# Patient Record
Sex: Female | Born: 1975 | Race: White | Hispanic: No | Marital: Married | Smoking: Never smoker
Health system: Southern US, Community
[De-identification: ages and names within clinical notes are randomized; demographics above are authoritative.]

## PROBLEM LIST (undated history)

## (undated) DIAGNOSIS — N159 Renal tubulo-interstitial disease, unspecified: Secondary | ICD-10-CM

---

## 1999-01-31 ENCOUNTER — Other Ambulatory Visit: Admission: RE | Admit: 1999-01-31 | Discharge: 1999-01-31 | Payer: Self-pay | Admitting: Obstetrics and Gynecology

## 1999-02-11 ENCOUNTER — Emergency Department (HOSPITAL_COMMUNITY): Admission: EM | Admit: 1999-02-11 | Discharge: 1999-02-11 | Payer: Self-pay | Admitting: Emergency Medicine

## 1999-08-27 ENCOUNTER — Inpatient Hospital Stay (HOSPITAL_COMMUNITY): Admission: AD | Admit: 1999-08-27 | Discharge: 1999-08-29 | Payer: Self-pay | Admitting: Obstetrics and Gynecology

## 1999-09-30 ENCOUNTER — Other Ambulatory Visit: Admission: RE | Admit: 1999-09-30 | Discharge: 1999-09-30 | Payer: Self-pay | Admitting: Obstetrics and Gynecology

## 2000-12-14 ENCOUNTER — Other Ambulatory Visit: Admission: RE | Admit: 2000-12-14 | Discharge: 2000-12-14 | Payer: Self-pay | Admitting: Obstetrics and Gynecology

## 2001-10-07 ENCOUNTER — Inpatient Hospital Stay (HOSPITAL_COMMUNITY): Admission: AD | Admit: 2001-10-07 | Discharge: 2001-10-09 | Payer: Self-pay | Admitting: Obstetrics and Gynecology

## 2001-12-13 ENCOUNTER — Other Ambulatory Visit: Admission: RE | Admit: 2001-12-13 | Discharge: 2001-12-13 | Payer: Self-pay | Admitting: Obstetrics and Gynecology

## 2003-04-18 ENCOUNTER — Other Ambulatory Visit: Admission: RE | Admit: 2003-04-18 | Discharge: 2003-04-18 | Payer: Self-pay | Admitting: Obstetrics and Gynecology

## 2005-01-08 ENCOUNTER — Ambulatory Visit: Payer: Self-pay | Admitting: Family Medicine

## 2005-11-24 ENCOUNTER — Ambulatory Visit: Payer: Self-pay | Admitting: Internal Medicine

## 2006-08-17 ENCOUNTER — Ambulatory Visit: Payer: Self-pay | Admitting: Family Medicine

## 2010-04-03 ENCOUNTER — Emergency Department (HOSPITAL_COMMUNITY): Admission: EM | Admit: 2010-04-03 | Discharge: 2010-04-03 | Payer: Self-pay | Admitting: Emergency Medicine

## 2018-03-22 ENCOUNTER — Ambulatory Visit: Payer: BLUE CROSS/BLUE SHIELD | Admitting: Psychology

## 2018-03-23 ENCOUNTER — Ambulatory Visit: Payer: BLUE CROSS/BLUE SHIELD | Admitting: Psychology

## 2019-09-30 ENCOUNTER — Other Ambulatory Visit: Payer: Self-pay | Admitting: Obstetrics and Gynecology

## 2019-09-30 DIAGNOSIS — E049 Nontoxic goiter, unspecified: Secondary | ICD-10-CM

## 2019-10-05 ENCOUNTER — Ambulatory Visit
Admission: RE | Admit: 2019-10-05 | Discharge: 2019-10-05 | Disposition: A | Discharge status: Home / Self Care | Payer: No Typology Code available for payment source | Source: Ambulatory Visit | Attending: Obstetrics and Gynecology | Admitting: Obstetrics and Gynecology

## 2019-10-05 DIAGNOSIS — E049 Nontoxic goiter, unspecified: Secondary | ICD-10-CM

## 2019-10-05 IMAGING — US US THYROID
1 series · 13 of 25 positions shown · non-contrast
Comparison: None.

CLINICAL DATA: Palpable abnormality. Thyromegaly on physical
examination.

EXAM:
THYROID ULTRASOUND
TECHNIQUE: Ultrasound examination of the thyroid gland and adjacent soft
tissues was performed.

[Series 1: us thyroid · 0.06mm/px · 13 of 46 slices shown]
[im 1/46]
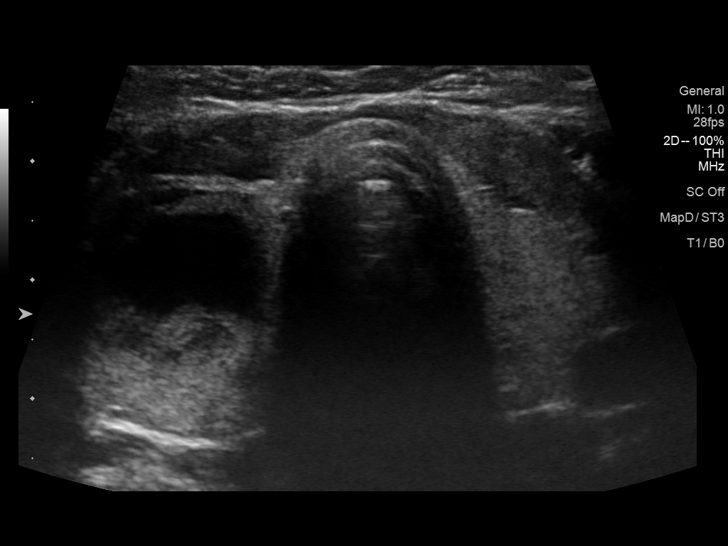
[im 4/46]
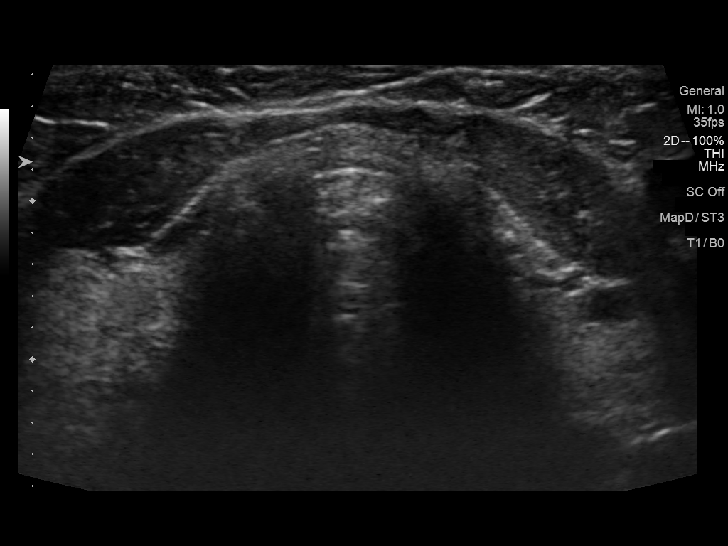
[im 8/46]
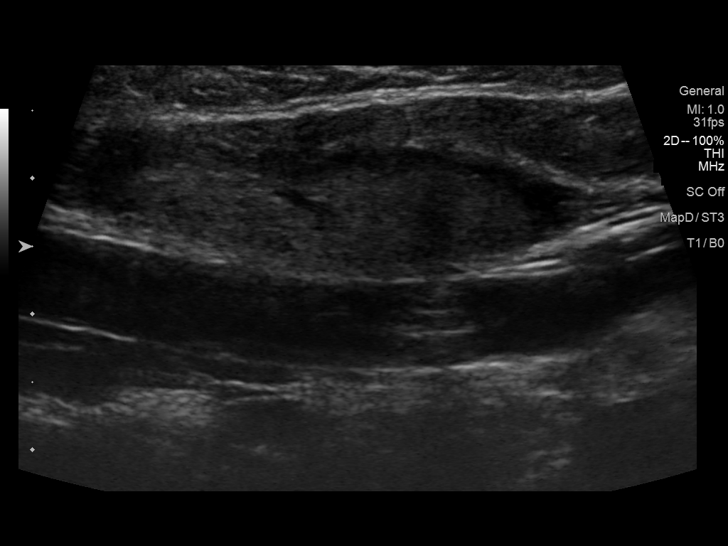
[im 12/46]
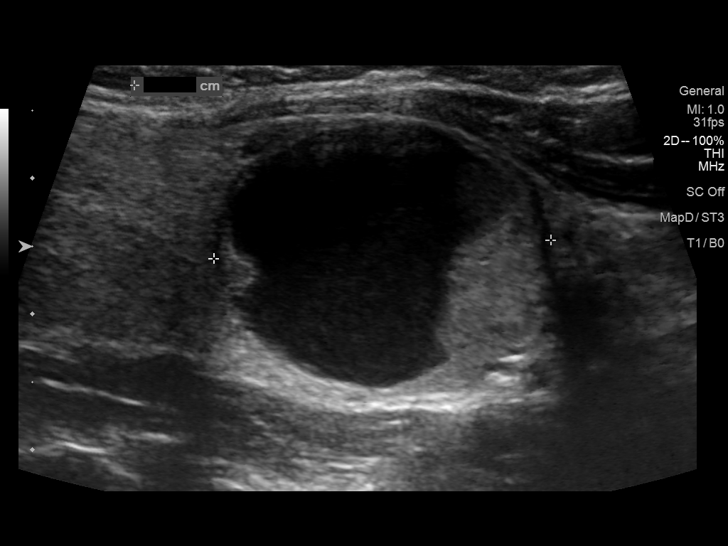
[im 16/46]
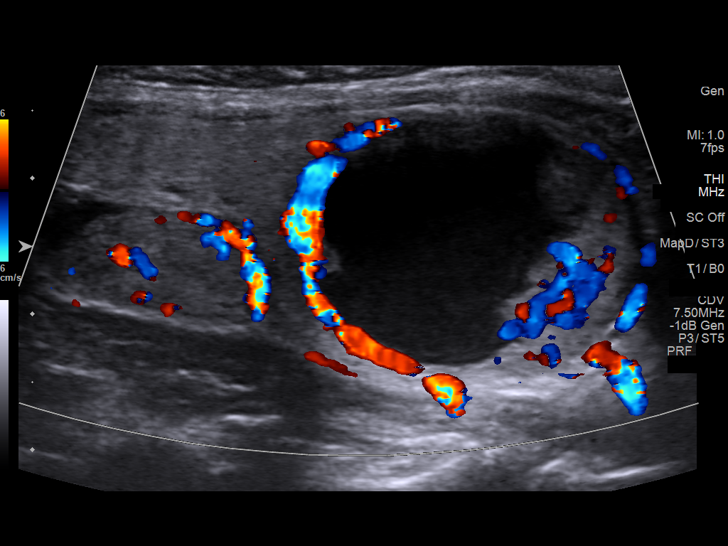
[im 19/46]
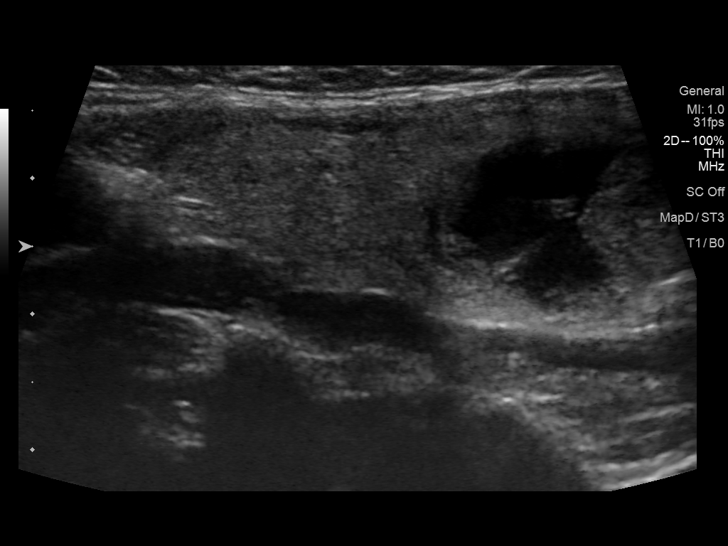
[im 23/46]
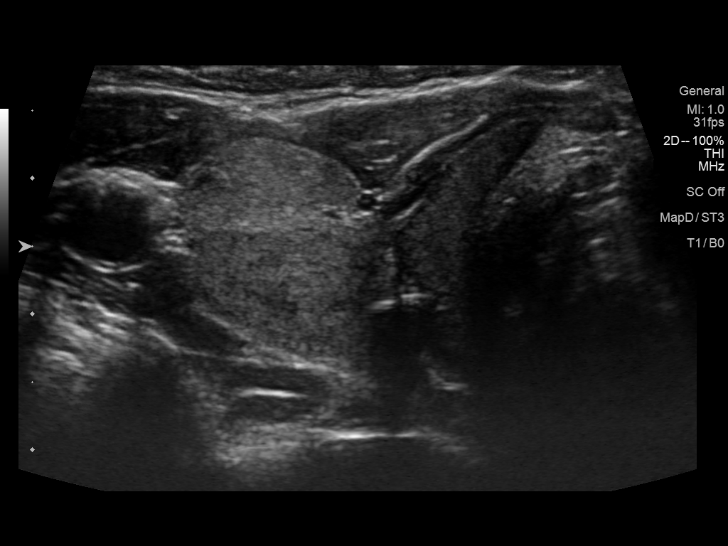
[im 27/46]
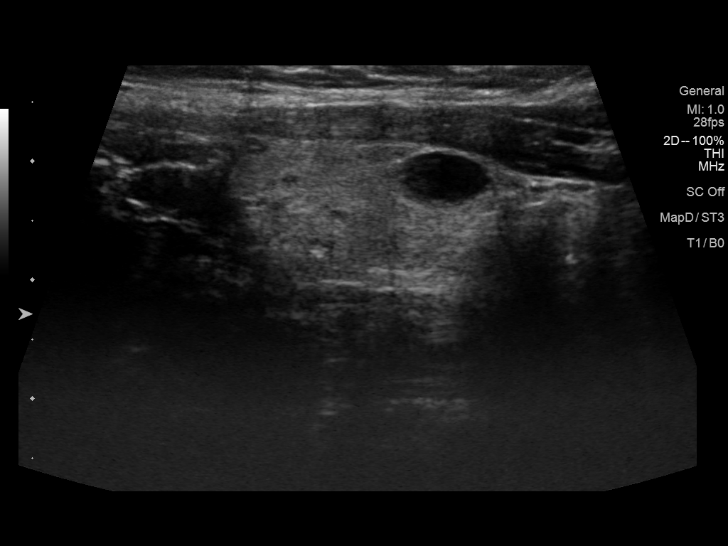
[im 31/46]
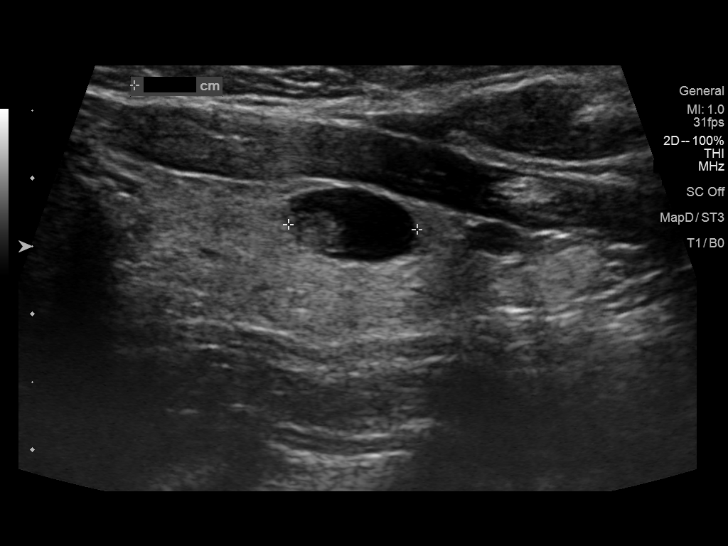
[im 34/46]
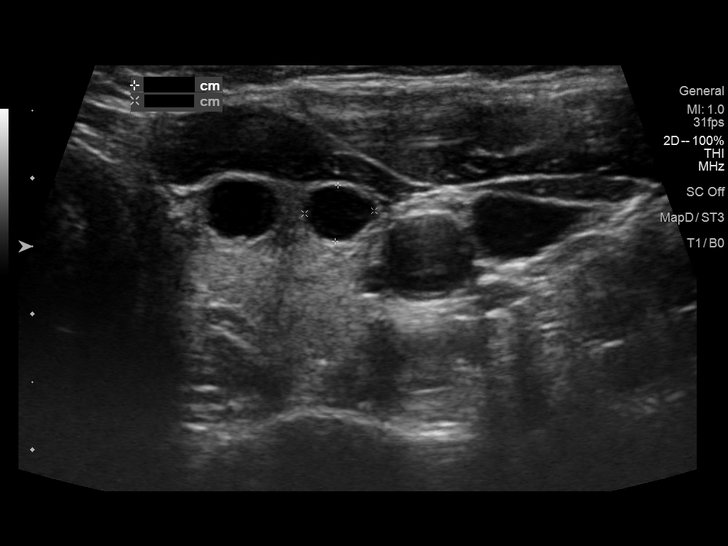
[im 38/46]
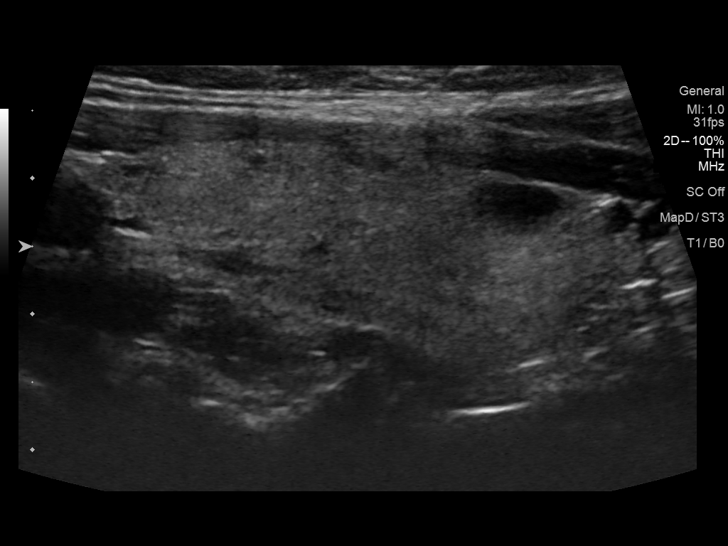
[im 42/46]
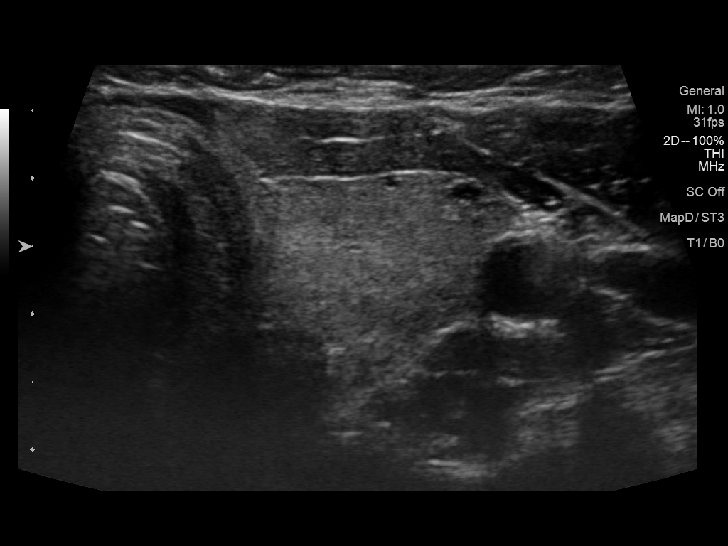
[im 46/46]
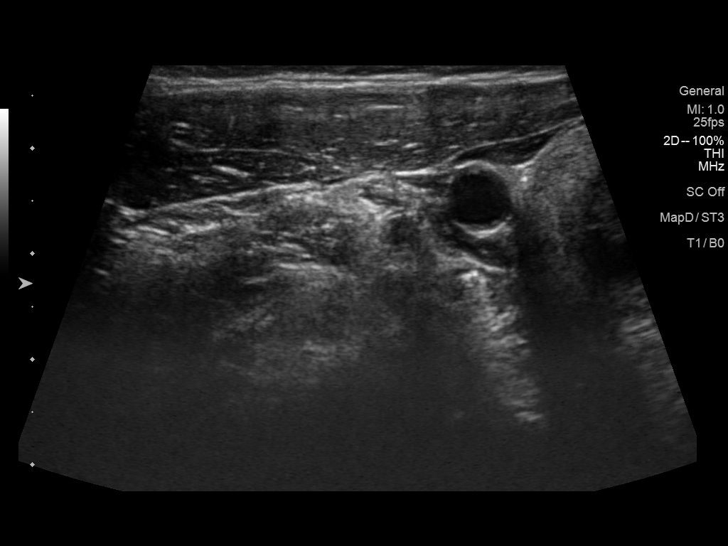

[13 of 25 positions shown; findings below may reference images not displayed]

FINDINGS: Parenchymal Echotexture: Mildly heterogenous

Isthmus: Normal in size measures 0.3 cm in diameter

Right lobe: Normal in size measuring 4.6 x 2.2 x 2.1 cm

Left lobe: Normal in size measuring 4.6 x 1.6 x 1.9 cm

_________________________________________________________

Estimated total number of nodules >/= 1 cm: 3

Number of spongiform nodules >/=  2 cm not described below (TR1): 0

Number of mixed cystic and solid nodules >/= 1.5 cm not described
below (TR2): 0

_________________________________________________________

Nodule # 1:

Location: Right; Superior

Maximum size: 1.8 cm; Other 2 dimensions: 1.4 x 1.0 cm

Composition: solid/almost completely solid (2)

Echogenicity: isoechoic (1)

Shape: not taller-than-wide (0)

Margins: ill-defined (0)

Echogenic foci: none (0)

ACR TI-RADS total points: 3.

ACR TI-RADS risk category: TR3 (3 points).

ACR TI-RADS recommendations:

*Given size (>/= 1.5 - 2.4 cm) and appearance, a follow-up
ultrasound in 1 year should be considered based on TI-RADS criteria.

_________________________________________________________

There is an approximately 2.5 x 2.4 x 2.0 cm partially solid though
predominantly cystic nodule within the inferior pole of the right
lobe of the thyroid (labeled 2) which does not meet criteria to
recommend percutaneous sampling or continued dedicated follow-up

_________________________________________________________

There are scattered punctate (sub 1 cm) cysts scattered within the
left lobe of the thyroid (labeled 3, 4 and 5), none of which meet
imaging criteria to recommend percutaneous sampling or continued
dedicated follow-up.
IMPRESSION: 1. Findings suggestive of multinodular goiter.
2. Nodule #1 meets imaging criteria to recommend a 1 year follow-up.

The above is in keeping with the ACR TI-RADS recommendations - [HOSPITAL] [0B];[DATE].

## 2019-12-23 ENCOUNTER — Other Ambulatory Visit: Payer: Self-pay

## 2019-12-23 ENCOUNTER — Emergency Department (HOSPITAL_COMMUNITY)
Admission: EM | Admit: 2019-12-23 | Discharge: 2019-12-24 | Disposition: A | Discharge status: Home / Self Care | Payer: No Typology Code available for payment source | Attending: Emergency Medicine | Admitting: Emergency Medicine

## 2019-12-23 ENCOUNTER — Encounter (HOSPITAL_COMMUNITY): Payer: Self-pay | Admitting: Emergency Medicine

## 2019-12-23 ENCOUNTER — Emergency Department (HOSPITAL_COMMUNITY): Payer: No Typology Code available for payment source

## 2019-12-23 DIAGNOSIS — R071 Chest pain on breathing: Secondary | ICD-10-CM | POA: Diagnosis not present

## 2019-12-23 DIAGNOSIS — R0789 Other chest pain: Secondary | ICD-10-CM | POA: Diagnosis present

## 2019-12-23 HISTORY — DX: Renal tubulo-interstitial disease, unspecified: N15.9

## 2019-12-23 LAB — URINALYSIS, ROUTINE W REFLEX MICROSCOPIC
Bilirubin Urine: NEGATIVE
Glucose, UA: NEGATIVE mg/dL
Hgb urine dipstick: NEGATIVE
Ketones, ur: NEGATIVE mg/dL
Leukocytes,Ua: NEGATIVE
Nitrite: NEGATIVE
Protein, ur: NEGATIVE mg/dL
Specific Gravity, Urine: 1.014 (ref 1.005–1.030)
pH: 7 (ref 5.0–8.0)

## 2019-12-23 LAB — BASIC METABOLIC PANEL
Anion gap: 13 (ref 5–15)
BUN: 17 mg/dL (ref 6–20)
CO2: 24 mmol/L (ref 22–32)
Calcium: 9.2 mg/dL (ref 8.9–10.3)
Chloride: 103 mmol/L (ref 98–111)
Creatinine, Ser: 0.87 mg/dL (ref 0.44–1.00)
GFR calc Af Amer: >60 mL/min (ref >60)
GFR calc non Af Amer: >60 mL/min (ref >60)
Glucose, Bld: 109 mg/dL — ABNORMAL HIGH (ref 70–99)
Potassium: 3.8 mmol/L (ref 3.5–5.1)
Sodium: 140 mmol/L (ref 135–145)

## 2019-12-23 LAB — I-STAT BETA HCG BLOOD, ED (MC, WL, AP ONLY): I-stat hCG, quantitative: <5 m[IU]/mL (ref <5)

## 2019-12-23 LAB — TROPONIN I (HIGH SENSITIVITY)
Troponin I (High Sensitivity): 3 ng/L (ref <18)
Troponin I (High Sensitivity): 4 ng/L (ref <18)

## 2019-12-23 LAB — CBC
HCT: 40.8 % (ref 36.0–46.0)
Hemoglobin: 13.3 g/dL (ref 12.0–15.0)
MCH: 30.3 pg (ref 26.0–34.0)
MCHC: 32.6 g/dL (ref 30.0–36.0)
MCV: 92.9 fL (ref 80.0–100.0)
Platelets: 272 10*3/uL (ref 150–400)
RBC: 4.39 MIL/uL (ref 3.87–5.11)
RDW: 11.9 % (ref 11.5–15.5)
WBC: 13.6 10*3/uL — ABNORMAL HIGH (ref 4.0–10.5)
nRBC: 0 % (ref 0.0–0.2)

## 2019-12-23 IMAGING — CR DG CHEST 2V
2 series · 2 of 2 positions shown · non-contrast
Comparison: None.

CLINICAL DATA: 43-year-old female with chest pain.

EXAM:
CHEST - 2 VIEW

[chest pa]
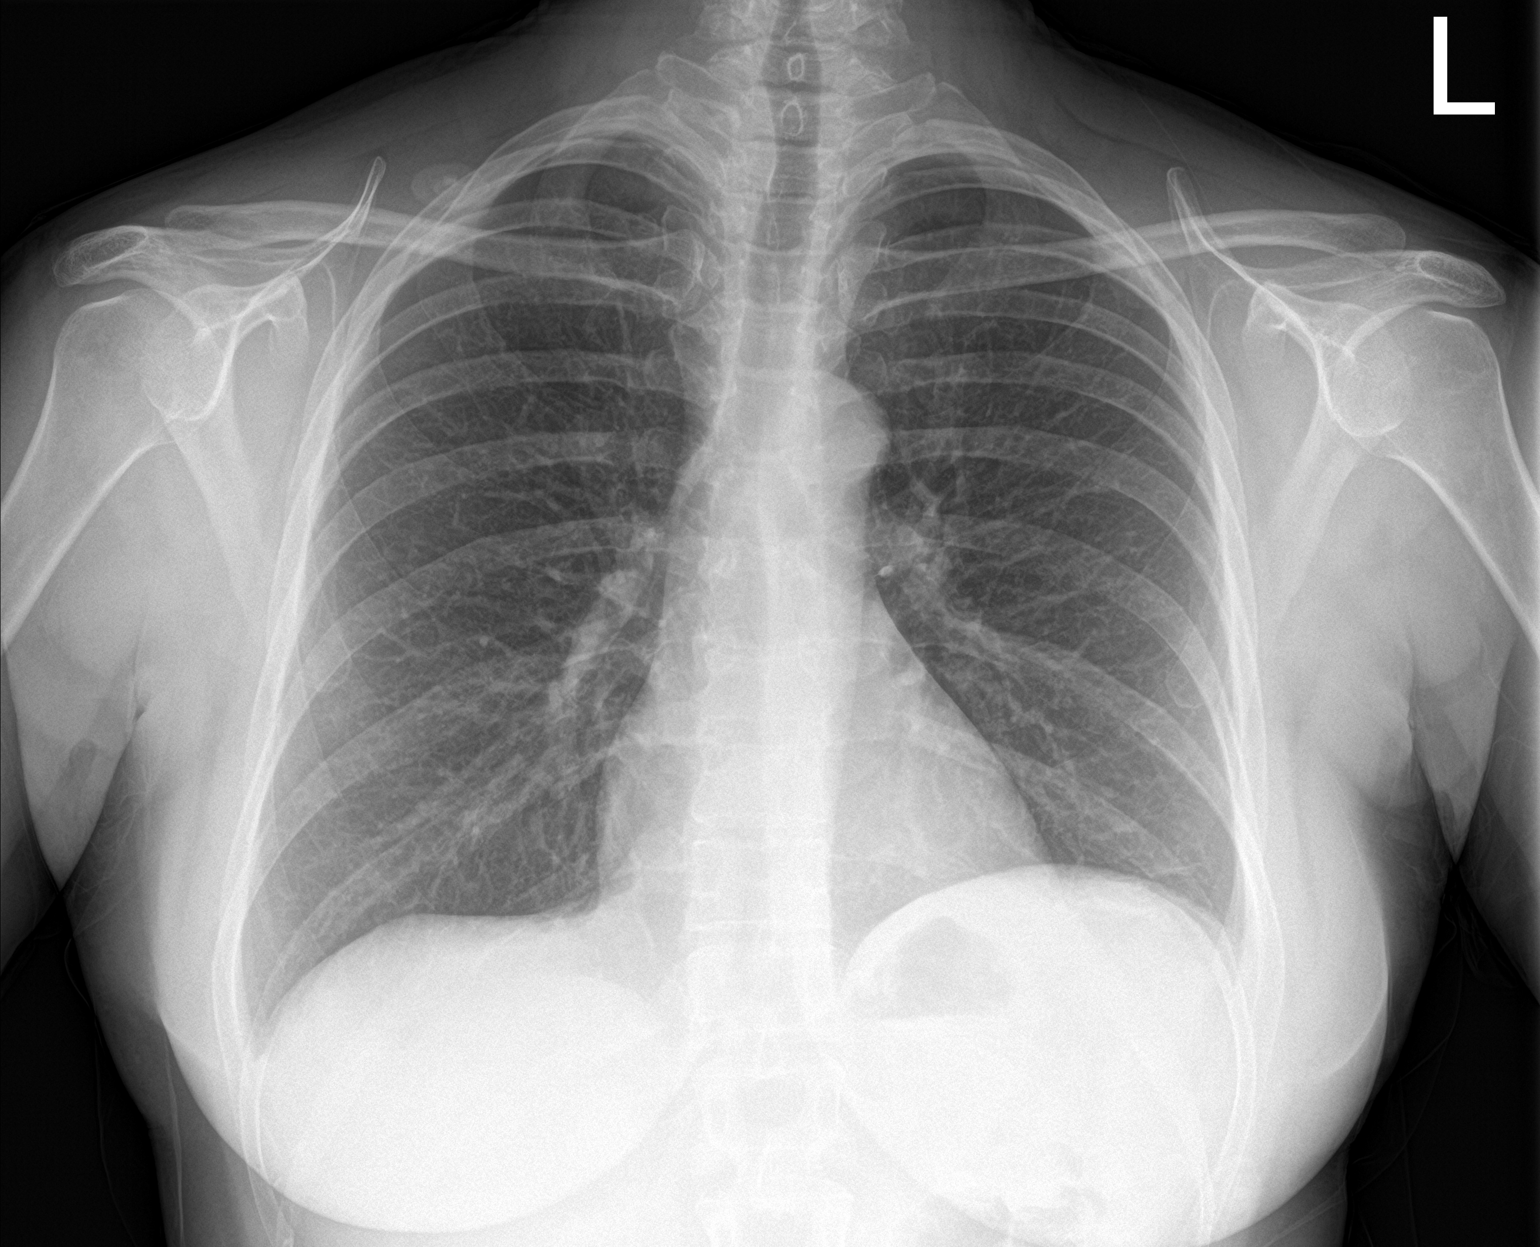

[chest lat]
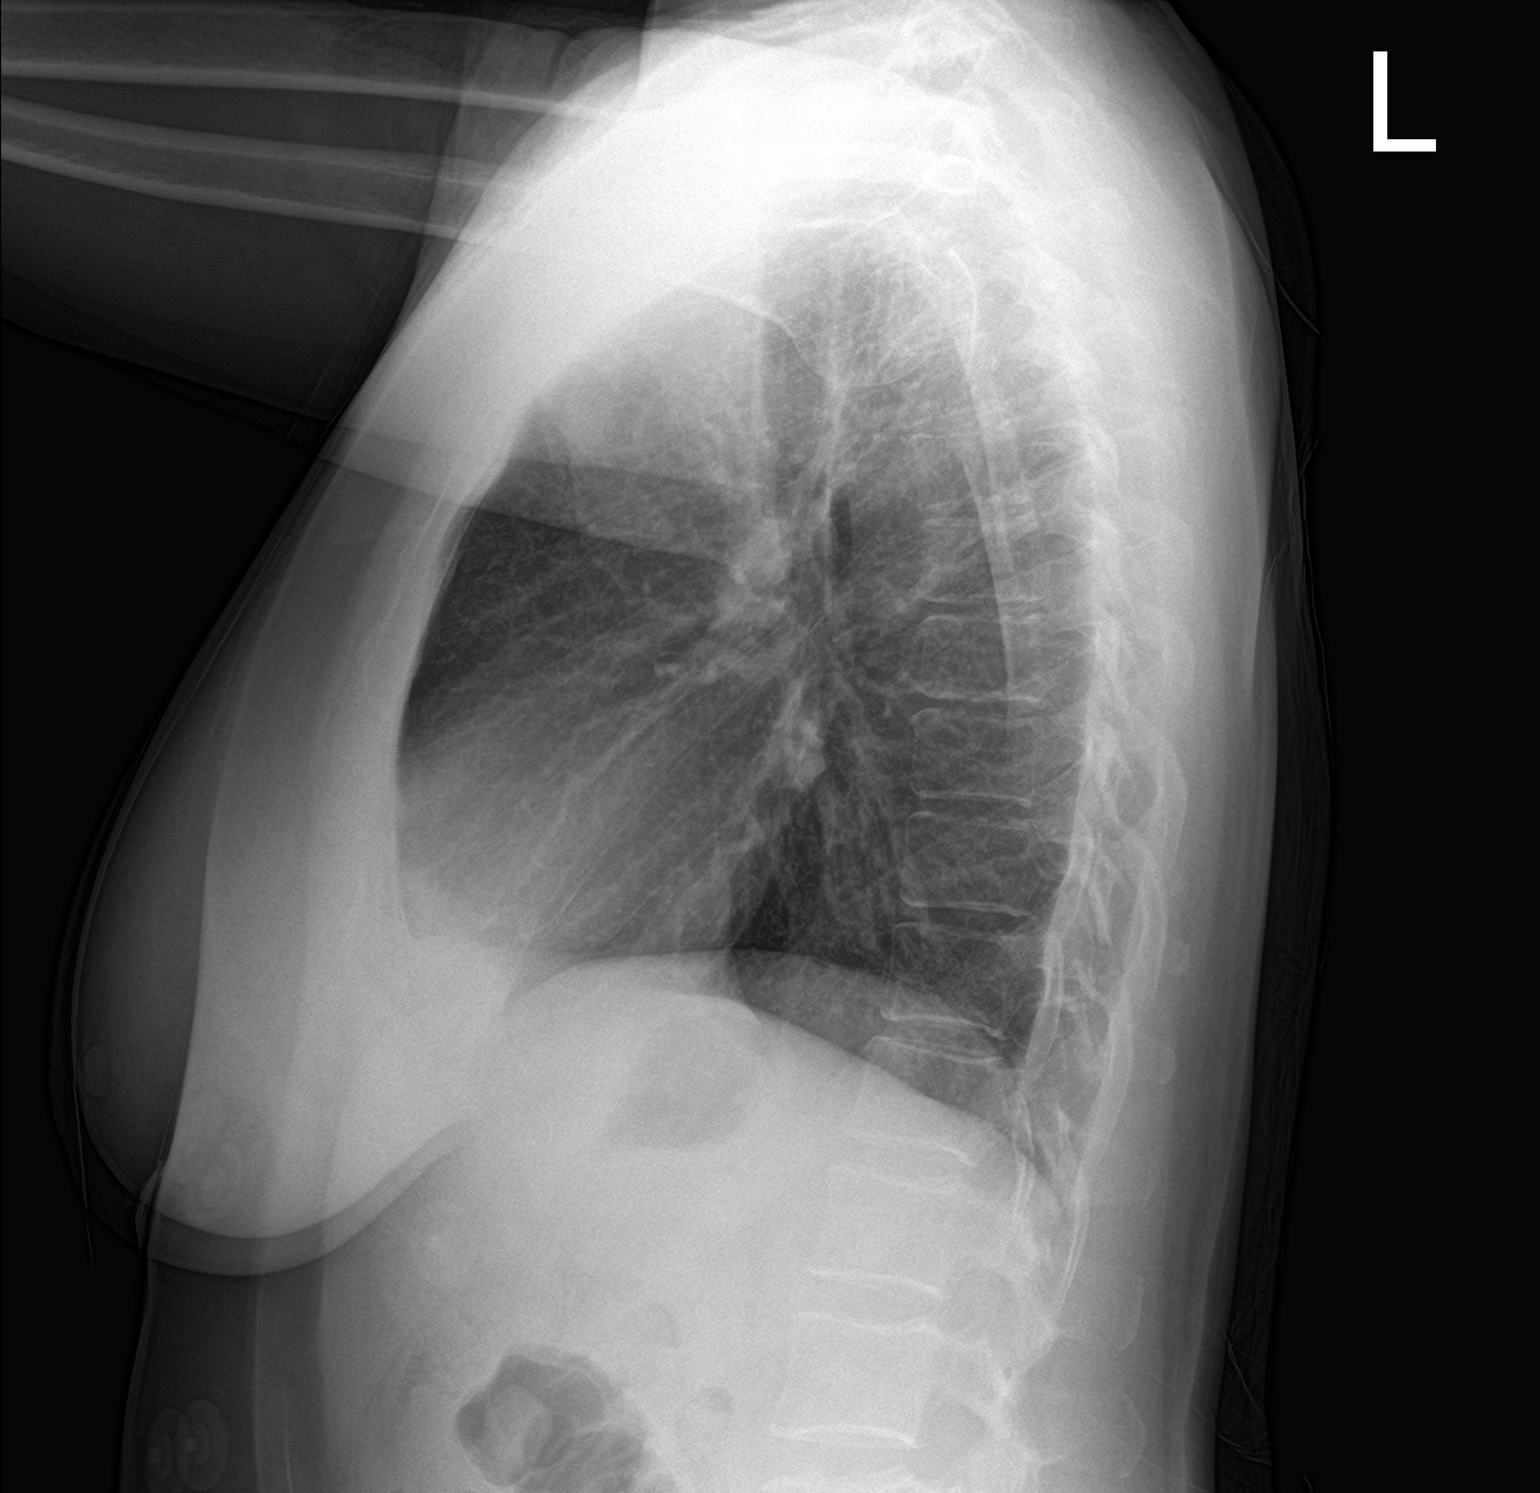

[2 of 2 positions shown; findings below may reference images not displayed]

FINDINGS: The heart size and mediastinal contours are within normal limits.
Both lungs are clear. The visualized skeletal structures are
unremarkable.
IMPRESSION: No active cardiopulmonary disease.

## 2019-12-23 MED ORDER — SODIUM CHLORIDE 0.9% FLUSH: 3.0000 mL | Freq: Once | INTRAVENOUS | Status: DC

## 2019-12-23 NOTE — ED Triage Notes (Signed)
C/o stabbing pain under L breast with deep inspiration since 4:30 am.  Reports fever since Monday.  Reports cloudy, foul smelling urine x 2 weeks.  Started Keflex Rx she had at home.

## 2019-12-23 NOTE — ED Provider Notes (Signed)
[facility] EMERGENCY DEPARTMENT Provider Note   CSN: [id number] Arrival date & time: 12/23/19  1751     History Chief Complaint  Patient presents with  . Chest Pain    Sharon Ross is a 44 y.o. female.  HPI Patient presents to the emergency department with discomfort under the lower ribs on the left.  The patient states that started around 430 this morning.  She states that it hurts worse with deep inspiration.  The patient states she took some ibuprofen and Tylenol without significant relief of her symptoms.  The patient states that she does not have shortness of breath or other symptoms at this time.  Patient states that she had some cloudy urine and it had a bad smell so she started Keflex this past Monday.  Patient states she also felt like she had a fever but no other symptoms.  Patient states that her urinary symptoms have resolved.  The patient denies shortness of breath, headache,blurred vision, neck pain, fever, cough, weakness, numbness, dizziness, anorexia, edema, abdominal pain, nausea, vomiting, diarrhea, rash, back pain, dysuria, hematemesis, bloody stool, near syncope, or syncope.    Past Medical History:  Diagnosis Date  . Kidney infection     There are no problems to display for this patient.   History reviewed. No pertinent surgical history.   OB History   No obstetric history on file.     No family history on file.  Social History   Tobacco Use  . Smoking status: Never Smoker  . Smokeless tobacco: Never Used  Substance Use Topics  . Alcohol use: Not Currently  . Drug use: Never    Home Medications Prior to Admission medications   Medication Sig Start Date End Date Taking? Authorizing Provider  acetaminophen (TYLENOL) 325 MG tablet Take 650 mg by mouth every 6 (six) hours as needed for mild pain.   Yes [provider]  ibuprofen (ADVIL) 200 MG tablet Take 200 mg by mouth every 6 (six) hours as needed for mild pain.    Yes [provider]    Allergies    Patient has no known allergies.  Review of Systems   Review of Systems All other systems negative except as documented in the HPI. All pertinent positives and negatives as reviewed in the HPI. Physical Exam Updated Vital Signs BP 108/71   Pulse 98   Temp 99.1 F (37.3 C) (Oral)   Resp 16   SpO2 100%   Physical Exam Vitals and nursing note reviewed.  Constitutional:      General: She is not in acute distress.    Appearance: She is well-developed.  HENT:     Head: Normocephalic and atraumatic.  Eyes:     Pupils: Pupils are equal, round, and reactive to light.  Cardiovascular:     Rate and Rhythm: Normal rate and regular rhythm.     Heart sounds: Normal heart sounds. No murmur. No friction rub. No gallop.   Pulmonary:     Effort: Pulmonary effort is normal. No respiratory distress.     Breath sounds: Normal breath sounds. No decreased breath sounds, wheezing, rhonchi or rales.  Abdominal:     General: Bowel sounds are normal. There is no distension.     Palpations: Abdomen is soft.     Tenderness: There is no abdominal tenderness. There is no guarding or rebound.  Musculoskeletal:     Cervical back: Normal range of motion and neck supple.  Skin:  General: Skin is warm and dry.     Capillary Refill: Capillary refill takes less than 2 seconds.     Findings: No erythema or rash.  Neurological:     Mental Status: She is alert and oriented to person, place, and time.     Motor: No abnormal muscle tone.     Coordination: Coordination normal.  Psychiatric:        Behavior: Behavior normal.     ED Results / Procedures / Treatments   Labs (all labs ordered are listed, but only abnormal results are displayed) Labs Reviewed  BASIC METABOLIC PANEL - Abnormal; Notable for the following components:      Result Value   Glucose, Bld 109 (*)    All other components within normal limits  CBC - Abnormal; Notable for the following  components:   WBC 13.6 (*)    All other components within normal limits  URINALYSIS, ROUTINE W REFLEX MICROSCOPIC  D-DIMER, QUANTITATIVE (NOT AT [facility])  I-STAT BETA HCG BLOOD, ED ([facility], [facility], [facility] ONLY)  TROPONIN I (HIGH SENSITIVITY)  TROPONIN I (HIGH SENSITIVITY)    EKG EKG Interpretation  Date/Time:  Saturday December 23 2019 22:46:07 EDT Ventricular Rate:  96 PR Interval:    QRS Duration: 89 QT Interval:  343 QTC Calculation: 434 R Axis:   62 Text Interpretation: Sinus rhythm RSR' in V1 or V2, right VCD or RVH Borderline T abnormalities, anterior leads No STEMI Confirmed by Alvester Chou [phone]) on 12/23/2019 10:58:44 PM   Radiology DG Chest 2 View  Result Date: 12/23/2019 CLINICAL DATA:  44 year old female with chest pain. EXAM: CHEST - 2 VIEW COMPARISON:  None. FINDINGS: The heart size and mediastinal contours are within normal limits. Both lungs are clear. The visualized skeletal structures are unremarkable. IMPRESSION: No active cardiopulmonary disease. Electronically Signed   By: Elgie Collard M.D.   On: 12/23/2019 20:48    Procedures Procedures (including critical care time)  Medications Ordered in ED Medications  sodium chloride flush (NS) 0.9 % injection 3 mL (has no administration in time range)    ED Course  I have reviewed the triage vital signs and the nursing notes.  Pertinent labs & imaging results that were available during my care of the patient were reviewed by me and considered in my medical decision making (see chart for details).    MDM Rules/Calculators/A&P                     The patient's testing thus far does not show any significant abnormalities.  She does have a slight white count but no other laboratory findings that would yield an answer at this point.  Her troponins were negative.  Her EKG does not show any acute changes.  Her D-dimer is pending.  I feel that this is pleuritic type pain based on her HPI and physical exam findings.  The patient  does not have any acute distress on examination.  I cannot reproduce the chest pain externally. Final Clinical Impression(s) / ED Diagnoses Final diagnoses:  None    Rx / DC Orders ED Discharge Orders    None       Charlestine Night, PA-C 12/24/19 0006    Terald Sleeper, MD 12/24/19 [phone]

## 2019-12-24 ENCOUNTER — Emergency Department (HOSPITAL_COMMUNITY): Payer: No Typology Code available for payment source

## 2019-12-24 LAB — D-DIMER, QUANTITATIVE: D-Dimer, Quant: 5.66 ug/mL-FEU — ABNORMAL HIGH (ref 0.00–0.50)

## 2019-12-24 IMAGING — CT CT ANGIO CHEST
2 of 6 series · 19 of 36 positions shown · IV contrast (omnipaque)
Comparison: None.

CLINICAL DATA: Left chest stabbing pain. Fever.

EXAM:
CT ANGIOGRAPHY CHEST WITH CONTRAST
TECHNIQUE: Multidetector CT imaging of the chest was performed using the
standard protocol during bolus administration of intravenous
contrast. Multiplanar CT image reconstructions and MIPs were
obtained to evaluate the vascular anatomy.
CONTRAST:  100mL OMNIPAQUE IOHEXOL 350 MG/ML SOLN

[Series 7: pe thins · axial · 0.89mm/px · z∈[+1053,+1289]mm · 18 of 375 slices shown]
[im 19/375  lung]
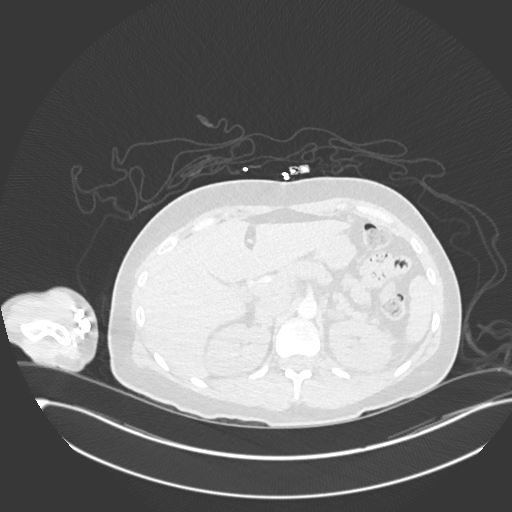
[im 38/375  mediastinal]
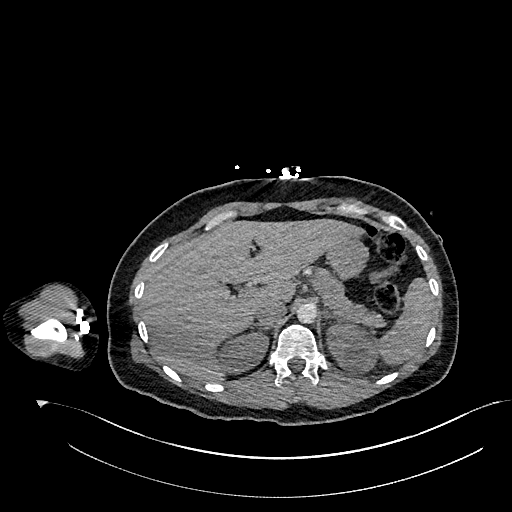
[im 57/375  lung]
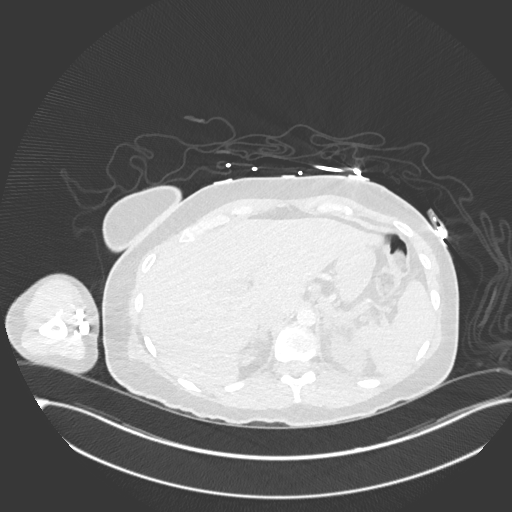
[im 75/375  mediastinal]
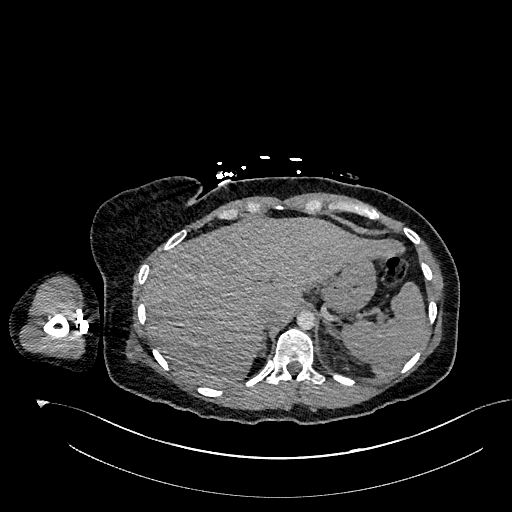
[im 94/375  lung]
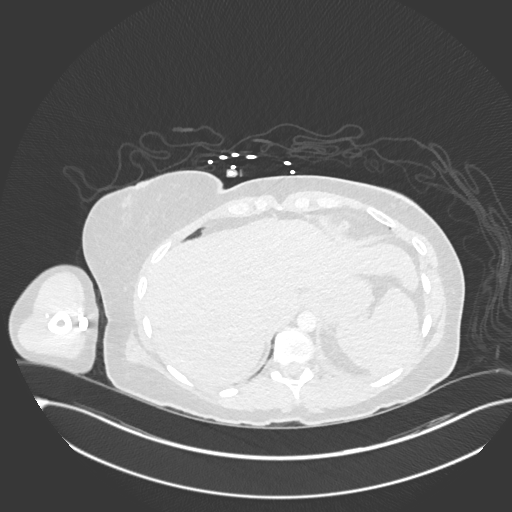
[im 113/375  mediastinal]
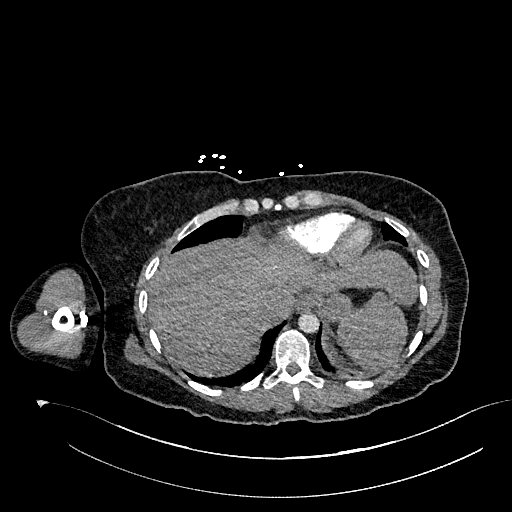
[im 131/375  lung]
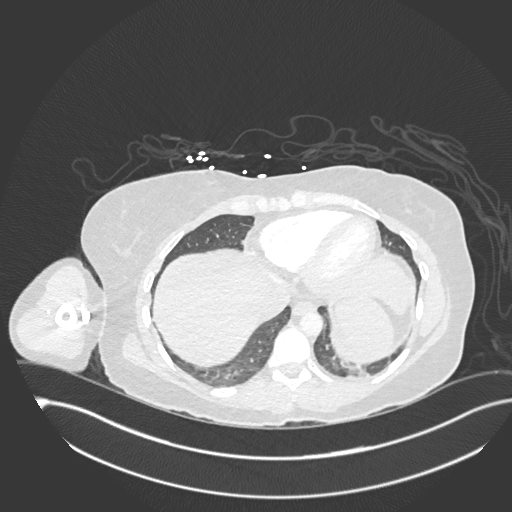
[im 150/375  mediastinal]
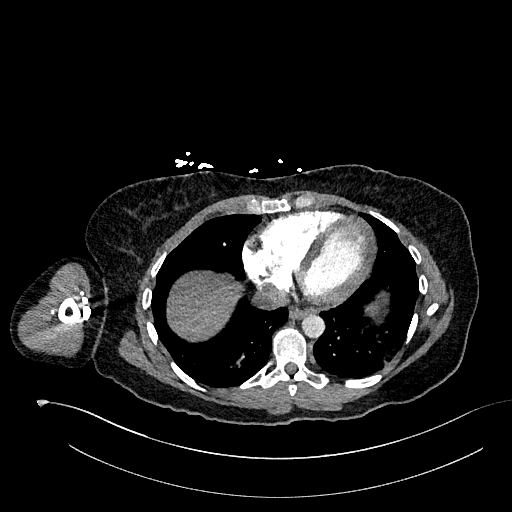
[im 169/375  lung]
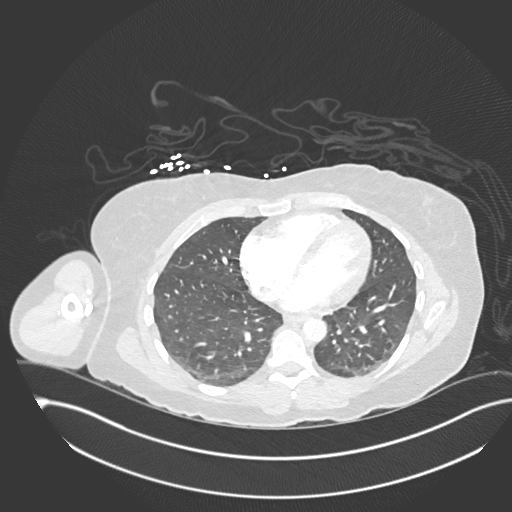
[im 206/375  mediastinal]
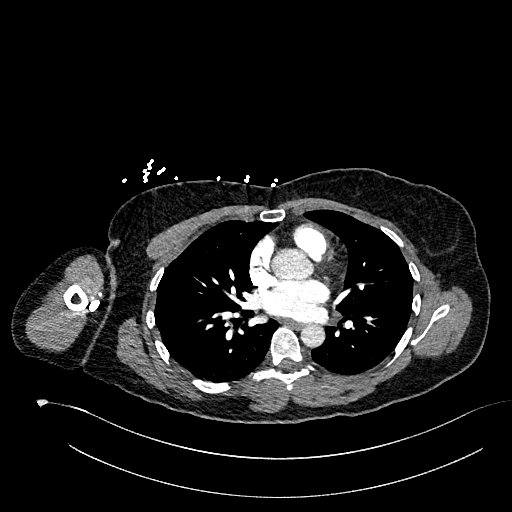
[im 225/375  lung]
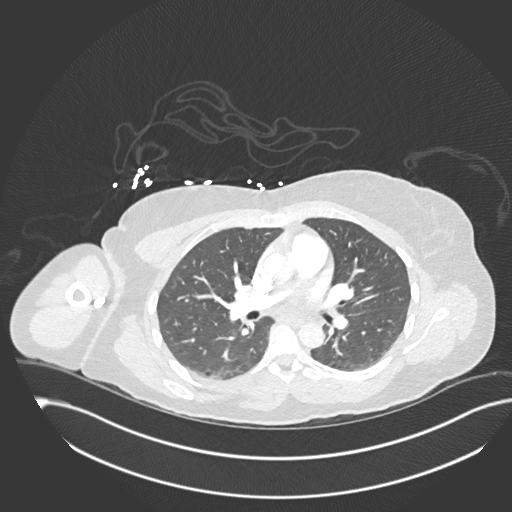
[im 244/375  mediastinal]
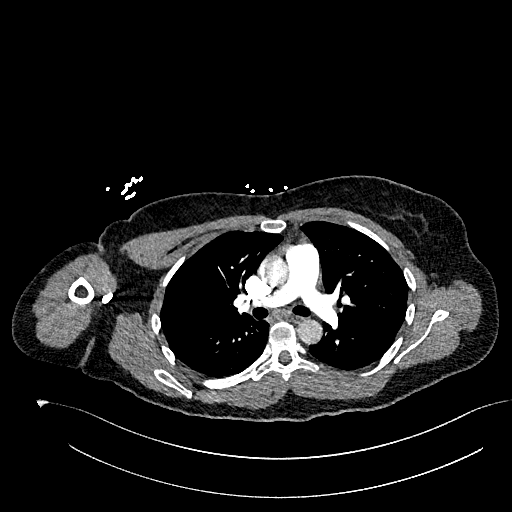
[im 262/375  lung]
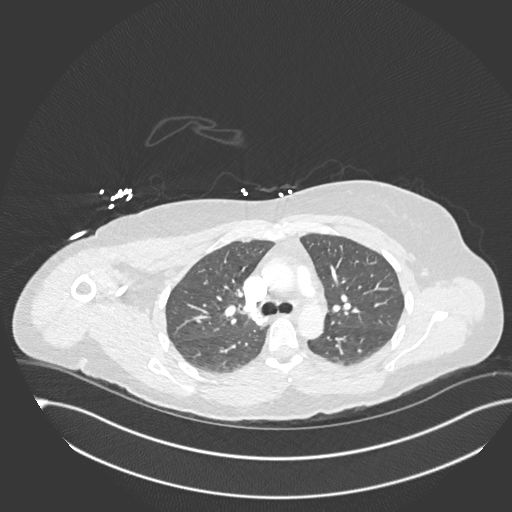
[im 281/375  mediastinal]
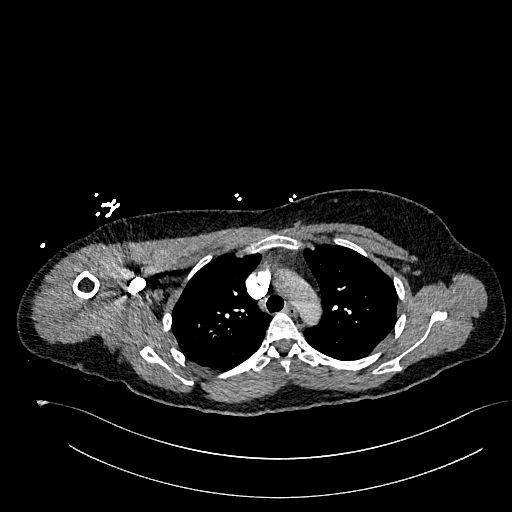
[im 300/375  lung]
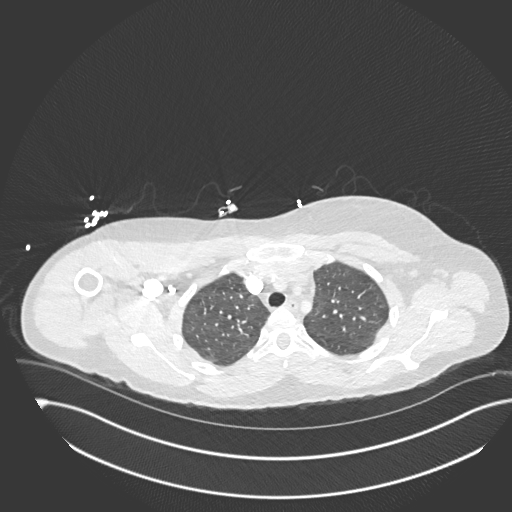
[im 318/375  mediastinal]
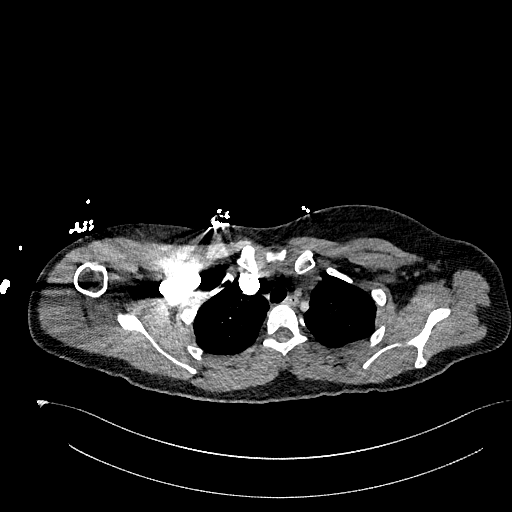
[im 337/375  lung]
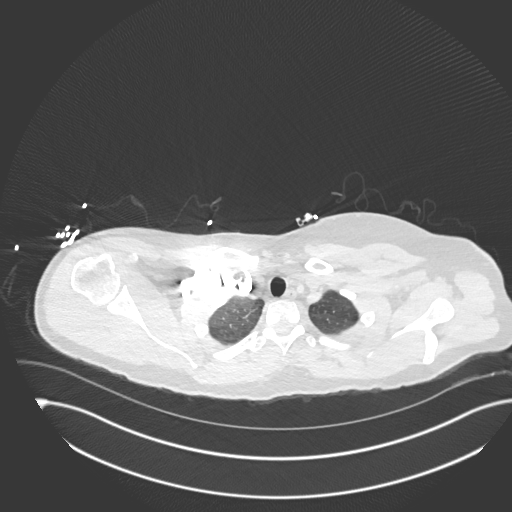
[im 356/375  mediastinal]
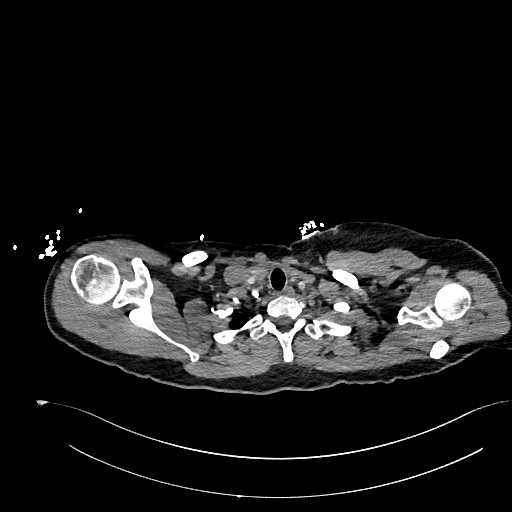

[Series 8: pe 2mm cor · coronal · 0.53mm/px · 1 of 138 slices shown]
[im 69/138  mediastinal]
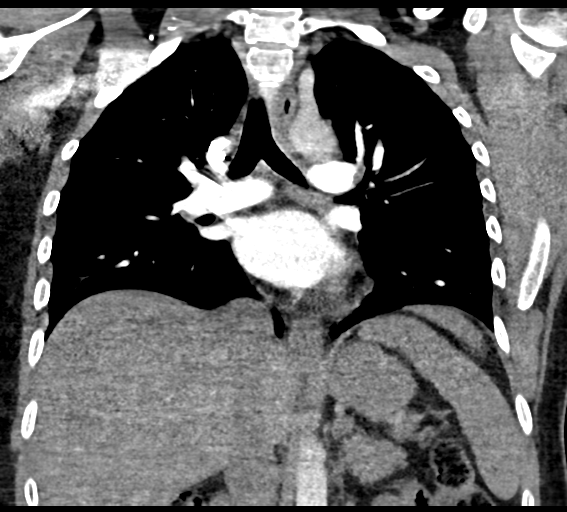

[19 of 36 positions shown; findings below may reference images not displayed]

FINDINGS: Cardiovascular: Contrast injection is sufficient to demonstrate
satisfactory opacification of the pulmonary arteries to the
segmental level. There is no pulmonary embolus or evidence of right
heart strain. The size of the main pulmonary artery is normal. Heart
size is normal, with no pericardial effusion. The course and caliber
of the aorta are normal. There is no atherosclerotic calcification.
Opacification decreased due to pulmonary arterial phase contrast
bolus timing.

Mediastinum/Nodes: No mediastinal, hilar or axillary
lymphadenopathy. Normal visualized thyroid. Thoracic esophageal
course is normal.

Lungs/Pleura: Airways are patent. No pleural effusion, lobar
consolidation, pneumothorax or pulmonary infarction. Bibasilar
atelectasis.

Upper Abdomen: Contrast bolus timing is not optimized for evaluation
of the abdominal organs. The visualized portions of the organs of
the upper abdomen are normal.

Musculoskeletal: No chest wall abnormality. No bony spinal canal
stenosis.

Review of the MIP images confirms the above findings.
IMPRESSION: No pulmonary embolus or other acute thoracic abnormality.

## 2019-12-24 MED ORDER — KETOROLAC TROMETHAMINE 60 MG/2ML IM SOLN
60.0000 mg | Freq: Once | INTRAMUSCULAR | Status: AC
  Administered 2019-12-24: 60 mg via INTRAMUSCULAR
  Filled 2019-12-24: qty 2

## 2019-12-24 MED ORDER — IOHEXOL 350 MG/ML SOLN
100.0000 mL | Freq: Once | INTRAVENOUS | Status: AC | PRN
  Administered 2019-12-24: 100 mL via INTRAVENOUS

## 2019-12-24 MED ORDER — IBUPROFEN 600 MG PO TABS: 600.0000 mg | ORAL_TABLET | Freq: Four times a day (QID) | ORAL | 0 refills | Status: AC | PRN

## 2019-12-24 NOTE — ED Notes (Signed)
Patient verbalizes understanding of discharge instructions. Opportunity for questioning and answers were provided. Armband removed by staff, pt discharged from ED stable & ambulatory  

## 2019-12-24 NOTE — Discharge Instructions (Signed)
Your tests in evaluation of left sided chest pain show no sign of infection, heart condition or event, or blood clot indicating likely chest wall pain. Take ibuprofen as prescribed and follow up with your doctor for recheck if pain continues.   If you develop new or worsening symptoms, please return to the emergency department.

## 2019-12-24 NOTE — ED Provider Notes (Signed)
Pain left lateral chest Pleuritic pain No PE risk factors D-dimer pending  Recheck patient, d-dimer, recheck temp.  1:40 - d-dimer significantly elevated at 5.66. CTA chest ordered to r/o PE. Patient and husband updated.   3:10 - CTA neg for PE. Will treat as chest wall pain, NSAIDs, warm compresses, PCP follow up.   This was discussed with the patient who is comfortable with discharge home.    Elpidio Anis, PA-C 12/24/19 0326    Nira Conn, MD 12/24/19 [phone]

## 2019-12-24 NOTE — ED Notes (Signed)
Lab contacted to follow up on D-dimer, will expedite

## 2020-06-07 ENCOUNTER — Other Ambulatory Visit (HOSPITAL_COMMUNITY): Payer: Self-pay | Admitting: Urology

## 2020-06-07 DIAGNOSIS — R932 Abnormal findings on diagnostic imaging of liver and biliary tract: Secondary | ICD-10-CM

## 2020-06-07 DIAGNOSIS — K769 Liver disease, unspecified: Secondary | ICD-10-CM

## 2020-06-14 ENCOUNTER — Ambulatory Visit (HOSPITAL_COMMUNITY): Payer: PRIVATE HEALTH INSURANCE

## 2020-06-14 ENCOUNTER — Other Ambulatory Visit: Payer: Self-pay

## 2020-06-14 ENCOUNTER — Ambulatory Visit (HOSPITAL_COMMUNITY)
Admission: RE | Admit: 2020-06-14 | Discharge: 2020-06-14 | Disposition: A | Discharge status: Home / Self Care | Payer: No Typology Code available for payment source | Source: Ambulatory Visit | Attending: Urology | Admitting: Urology

## 2020-06-14 DIAGNOSIS — K769 Liver disease, unspecified: Secondary | ICD-10-CM

## 2020-06-14 DIAGNOSIS — R932 Abnormal findings on diagnostic imaging of liver and biliary tract: Secondary | ICD-10-CM

## 2020-06-14 IMAGING — MR MR ABDOMEN WO/W CM
20 series · 48 of 48 positions shown · IV contrast (Gadavist)
Comparison: CT [facility] [DATE].

CLINICAL DATA: Evaluate liver lesion.  Abnormal CT from [DATE]

EXAM:
MRI ABDOMEN WITHOUT AND WITH CONTRAST
TECHNIQUE: Multiplanar multisequence MR imaging of the abdomen was performed
both before and after the administration of intravenous contrast.
CONTRAST:  6mL GADAVIST GADOBUTROL 1 MMOL/ML IV SOLN

[Series 4: cor haste · coronal · 6.0mm · 1.19mm/px · 2 of 28 slices shown]
[im 1/28]
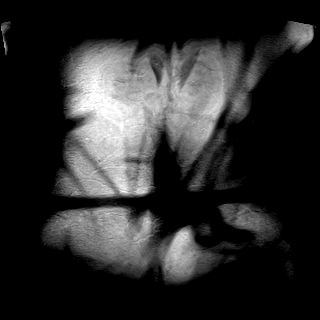
[im 28/28]
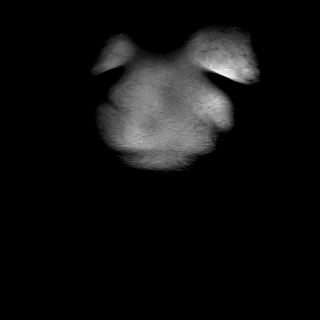

[Series 5: ax haste · axial · 6.0mm · 1.19mm/px · 1 of 27 slices shown]
[im 1/27]
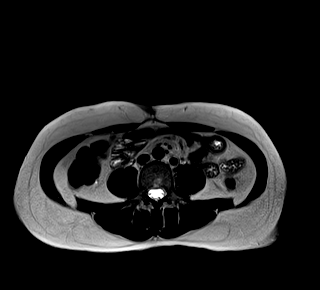

[Series 6: in phase (optional) · axial · 6.0mm · 0.74mm/px · 1 of 27 slices shown]
[im 1/27]
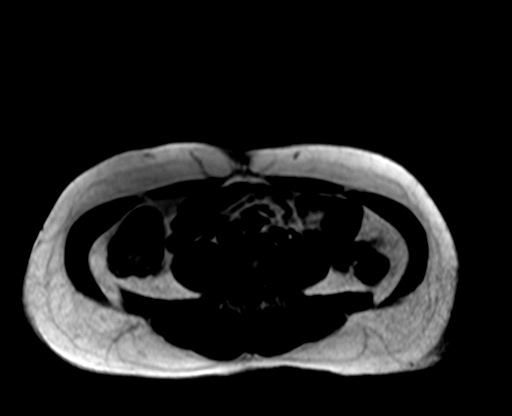

[Series 7: T1 · axial · 6.0mm · 0.74mm/px · 1 of 27 slices shown]
[im 1/27]
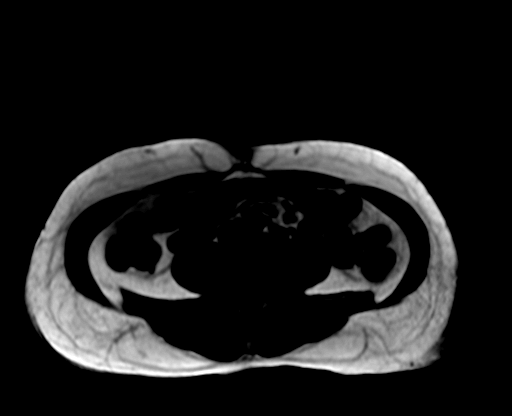

[Series 10: T2 fat-sat · axial · 6.0mm · 1.19mm/px · 1 of 30 slices shown]
[im 1/30]
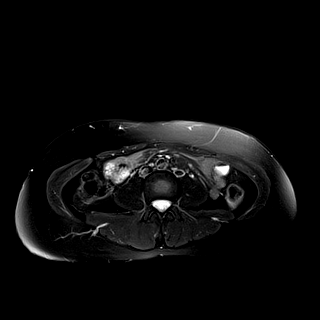

[Series 11: t1_vibe_opp-in_tra_p4_bh · axial · 3.0mm · 1.19mm/px · z∈[-165,+24]mm · 3 of 64 slices shown (1 of 2)]
[im 1/64]
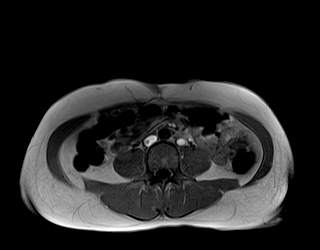
[im 32/64]
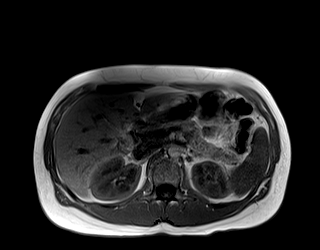
[im 64/64]
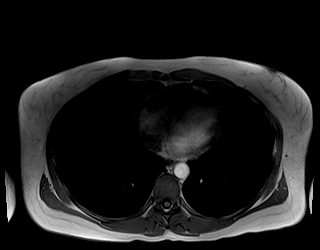

[Series 11: t1_vibe_opp-in_tra_p4_bh · axial · 3.0mm · 1.19mm/px · z∈[-165,+24]mm · 3 of 64 slices shown (2 of 2)]
[im 1/64]
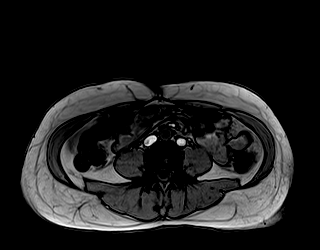
[im 32/64]
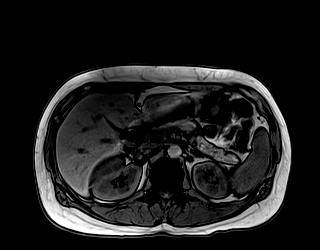
[im 64/64]
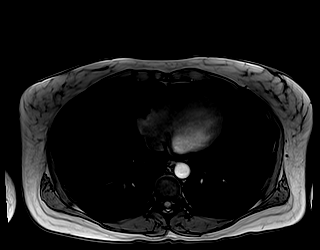

[Series 12: DWI · axial · 6.0mm · 1.42mm/px · z∈[-155,+54]mm · 4 of 90 slices shown (1 of 2)]
[im 1/90]
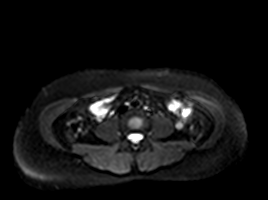
[im 30/90]
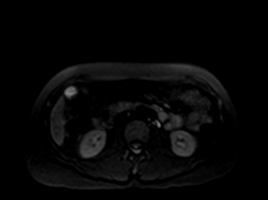
[im 60/90]
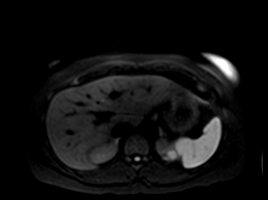
[im 90/90]
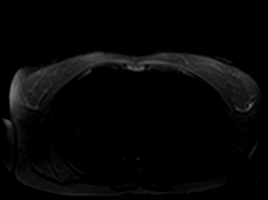

[Series 13: DWI · axial · 6.0mm · 1.42mm/px · 1 of 30 slices shown (2 of 2)]
[im 1/30]
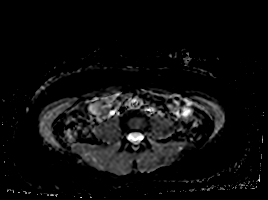

[Series 15: t1_vibe_fs_tra_p4_bh_pre · axial · 3.0mm · 1.19mm/px · z∈[-165,+24]mm · 3 of 64 slices shown]
[im 1/64]
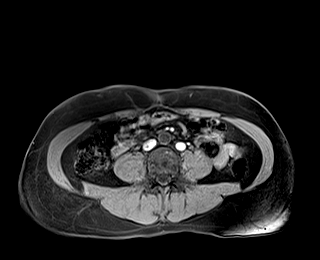
[im 32/64]
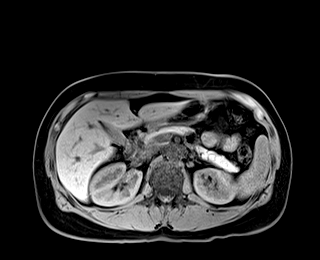
[im 64/64]
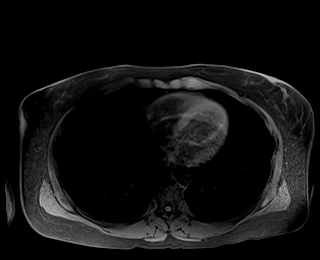

[Series 16: bSSFP · axial · 6.0mm · 0.74mm/px · 1 of 27 slices shown]
[im 1/27]
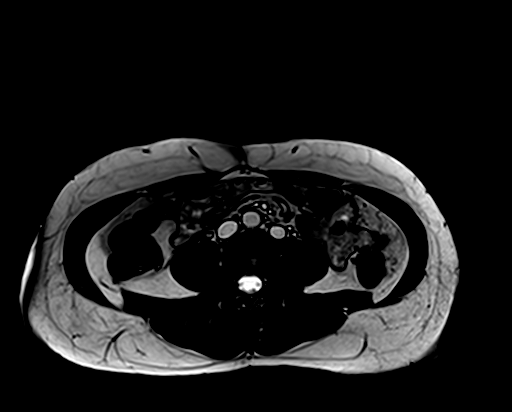

[Series 18: t1_vibe_fs_tra_p4_bh_post · axial · 3.0mm · 1.19mm/px · z∈[-165,+24]mm · 3 of 64 slices shown (1 of 4)]
[im 1/64]
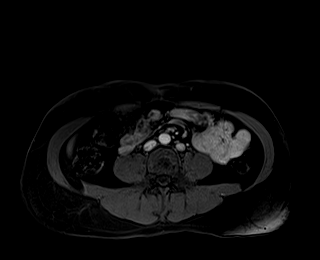
[im 32/64]
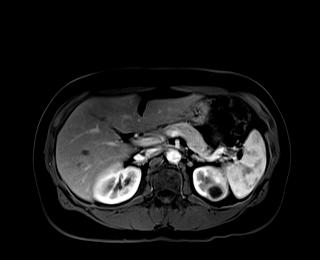
[im 64/64]
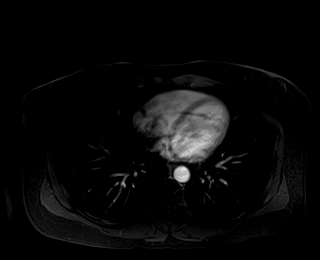

[Series 19: t1_vibe_fs_tra_p4_bh_post_sub · axial · 3.0mm · 1.19mm/px · z∈[-165,+24]mm · 3 of 64 slices shown (1 of 4)]
[im 1/64]
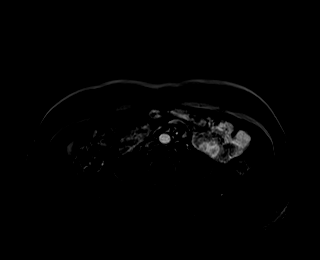
[im 32/64]
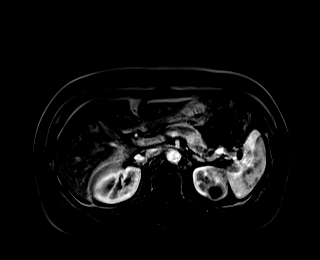
[im 64/64]
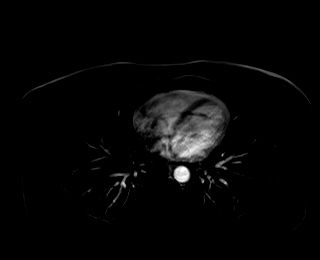

[Series 20: t1_vibe_fs_tra_p4_bh_post · axial · 3.0mm · 1.19mm/px · z∈[-165,+24]mm · 3 of 64 slices shown (2 of 4)]
[im 1/64]
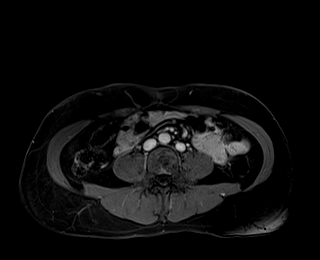
[im 32/64]
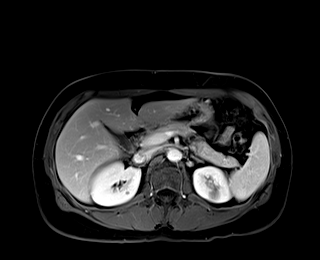
[im 64/64]
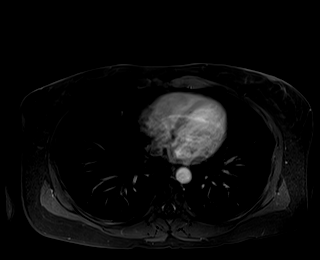

[Series 21: t1_vibe_fs_tra_p4_bh_post_sub · axial · 3.0mm · 1.19mm/px · z∈[-165,+24]mm · 3 of 64 slices shown (2 of 4)]
[im 1/64]
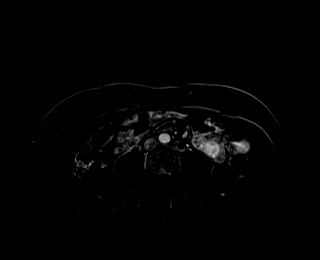
[im 32/64]
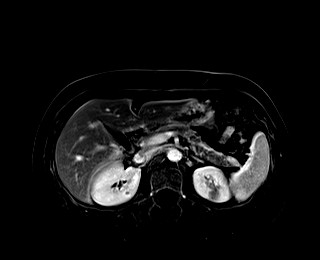
[im 64/64]
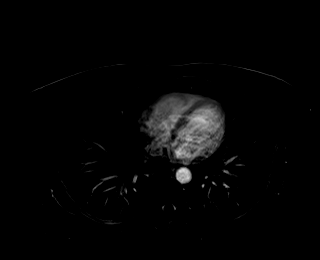

[Series 22: t1_vibe_fs_tra_p4_bh_post · axial · 3.0mm · 1.19mm/px · z∈[-165,+24]mm · 3 of 64 slices shown (3 of 4)]
[im 1/64]
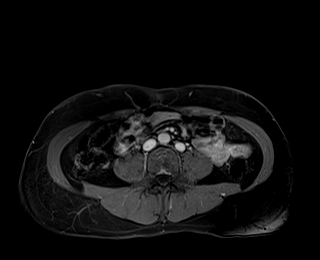
[im 32/64]
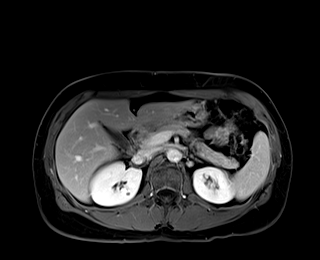
[im 64/64]
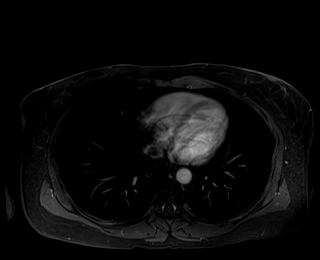

[Series 23: t1_vibe_fs_tra_p4_bh_post_sub · axial · 3.0mm · 1.19mm/px · z∈[-165,+24]mm · 3 of 64 slices shown (3 of 4)]
[im 1/64]
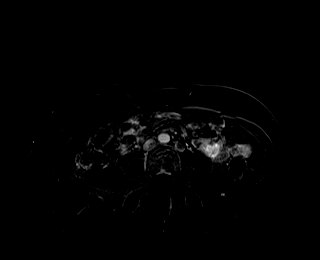
[im 32/64]
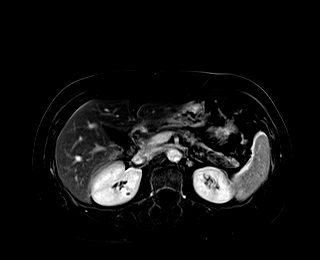
[im 64/64]
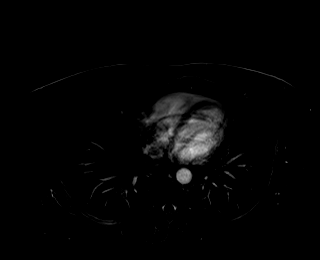

[Series 24: t1_vibe_fs_tra_p4_bh_post · axial · 3.0mm · 1.19mm/px · z∈[-165,+24]mm · 3 of 64 slices shown (4 of 4)]
[im 1/64]
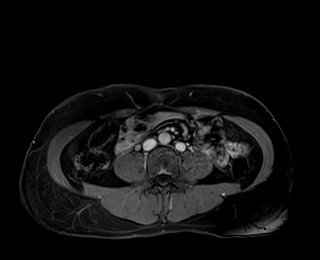
[im 32/64]
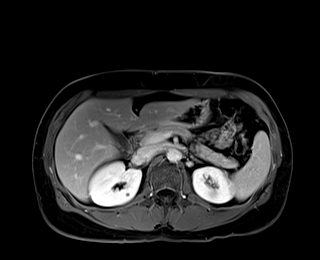
[im 64/64]
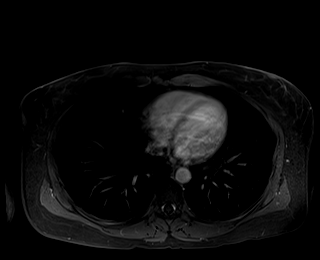

[Series 25: t1_vibe_fs_tra_p4_bh_post_sub · axial · 3.0mm · 1.19mm/px · z∈[-165,+24]mm · 3 of 64 slices shown (4 of 4)]
[im 1/64]
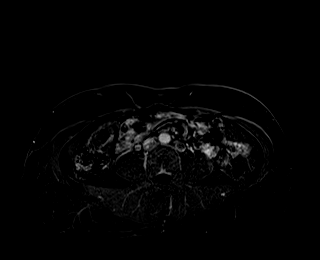
[im 32/64]
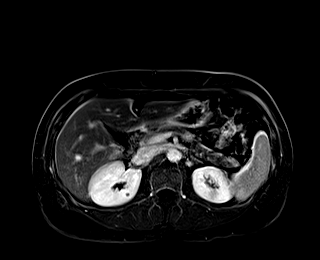
[im 64/64]
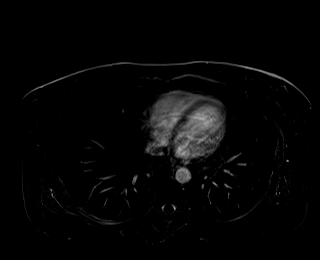

[Series 26: T1 dynamic post-contrast · coronal · 3.0mm · 1.31mm/px · 3 of 64 slices shown]
[im 1/64]
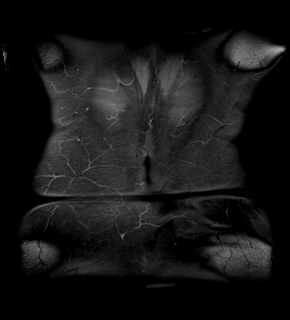
[im 32/64]
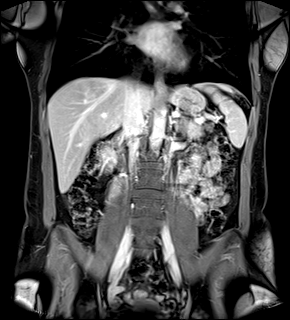
[im 64/64]
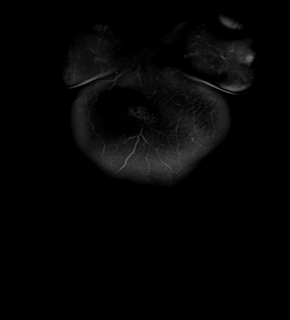

[48 of 48 positions shown; findings below may reference images not displayed]

FINDINGS: Lower chest: No acute findings.

Hepatobiliary: Within the far lateral aspect of the left hepatic
lobe there is a well-circumscribed T2 hyperintense lesion measuring
3.3 x 2.4 cm, image [DATE]. This corresponds to the abnormal area on
recent CT. On the contrast enhanced images there is early peripheral
discontinuous enhancement with delayed fill-in compatible with a
benign liver hemangioma. No suspicious liver lesions identified.
Gallbladder is unremarkable. No bile duct dilatation.

Pancreas: No mass, inflammatory changes, or other parenchymal
abnormality identified.

Spleen:  Within normal limits in size and appearance.

Adrenals/Urinary Tract: Normal appearance of the adrenal glands.
Tiny cyst is noted within the posterior cortex of right kidney
measuring less than 5 mm, image [DATE]. Within the posterior cortex of
the upper pole of left kidney there is a 1.9 cm mildly complex cyst
which has a peripheral rim of decreased T2 signal with internal
increased T2 signal. Following the IV administration of contrast
material there appears to be uniformly, smooth, enhancing mural
thickening, image 33/18.

Stomach/Bowel: Visualized portions within the abdomen are
unremarkable.

Vascular/Lymphatic: No pathologically enlarged lymph nodes
identified. No abdominal aortic aneurysm demonstrated. Duplicated
inferior vena cava is identified arising off the left renal vein.

Other:  None.

Musculoskeletal: No suspicious bone lesions identified.
IMPRESSION: 1. The abnormal area on recent CT corresponds to a benign liver
hemangioma.
2. There is a complex cystic lesion with diffuse mural thickening
and enhancement arising from the posterior cortex of the left
kidney. Urologic consultation is advised.
3. Duplicated inferior vena cava arising off the left renal vein.

## 2020-06-14 MED ORDER — GADOBUTROL 1 MMOL/ML IV SOLN
6.0000 mL | Freq: Once | INTRAVENOUS | Status: AC | PRN
  Administered 2020-06-14: 6 mL via INTRAVENOUS

## 2020-12-13 ENCOUNTER — Other Ambulatory Visit: Payer: Self-pay | Admitting: Obstetrics and Gynecology

## 2020-12-13 DIAGNOSIS — E041 Nontoxic single thyroid nodule: Secondary | ICD-10-CM

## 2020-12-18 ENCOUNTER — Ambulatory Visit
Admission: RE | Admit: 2020-12-18 | Discharge: 2020-12-18 | Disposition: A | Discharge status: Home / Self Care | Payer: No Typology Code available for payment source | Source: Ambulatory Visit | Attending: Obstetrics and Gynecology | Admitting: Obstetrics and Gynecology

## 2020-12-18 DIAGNOSIS — E041 Nontoxic single thyroid nodule: Secondary | ICD-10-CM

## 2020-12-18 IMAGING — US US THYROID
1 series · 13 of 25 positions shown · non-contrast
Comparison: [DATE]

CLINICAL DATA: Prior ultrasound follow-up.

EXAM:
THYROID ULTRASOUND
TECHNIQUE: Ultrasound examination of the thyroid gland and adjacent soft
tissues was performed.

[Series 1: us thyroid · 0.08mm/px · 13 of 94 slices shown]
[im 1/94]
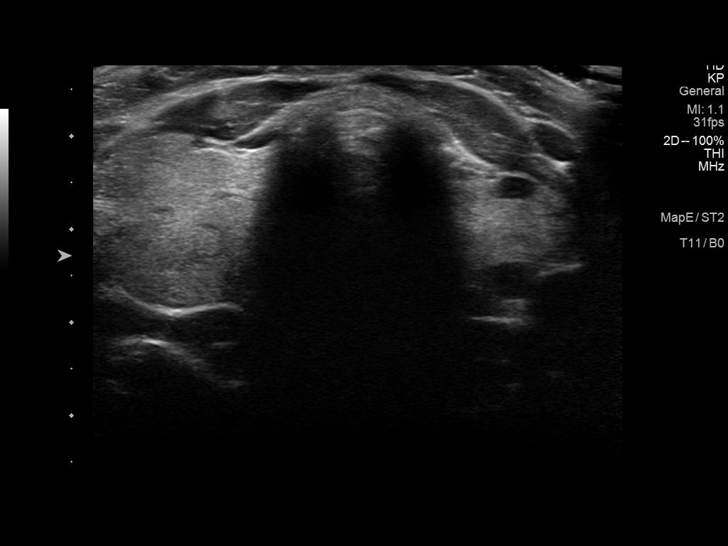
[im 8/94]
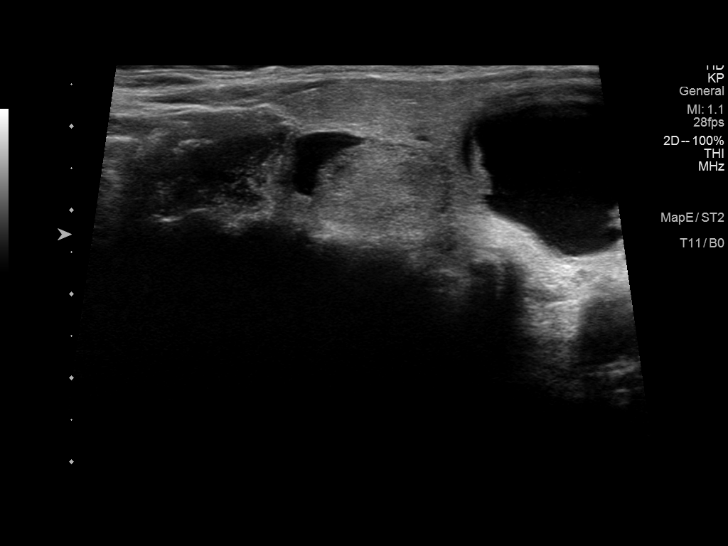
[im 16/94]
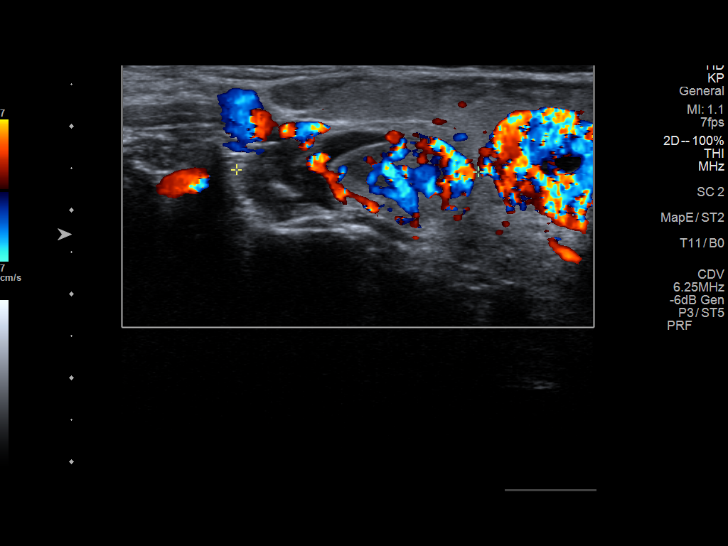
[im 24/94]
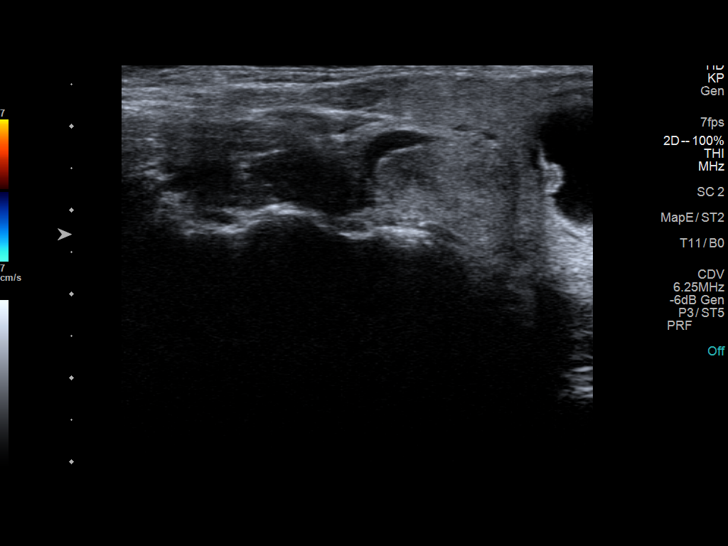
[im 32/94]
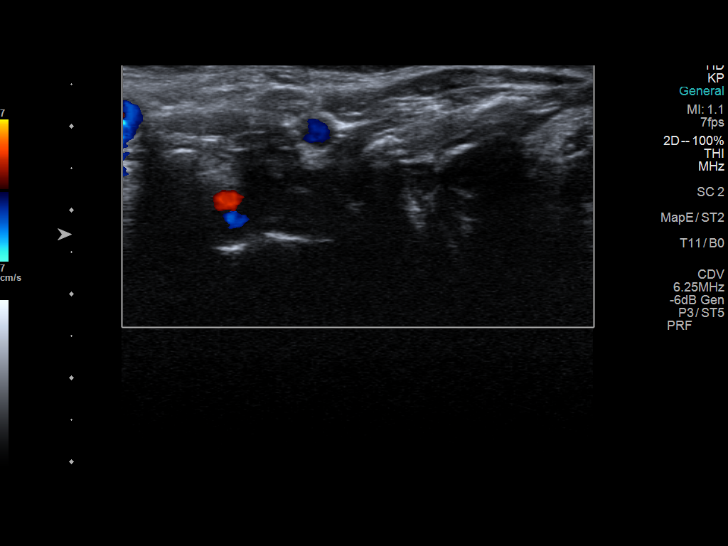
[im 39/94]
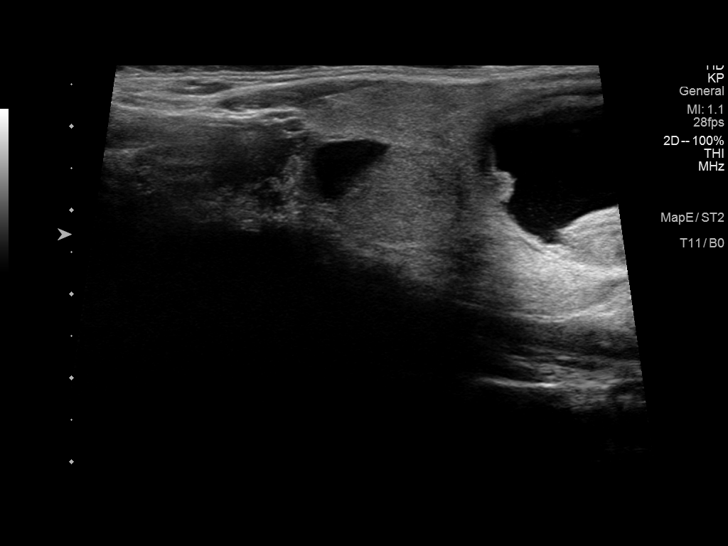
[im 47/94]
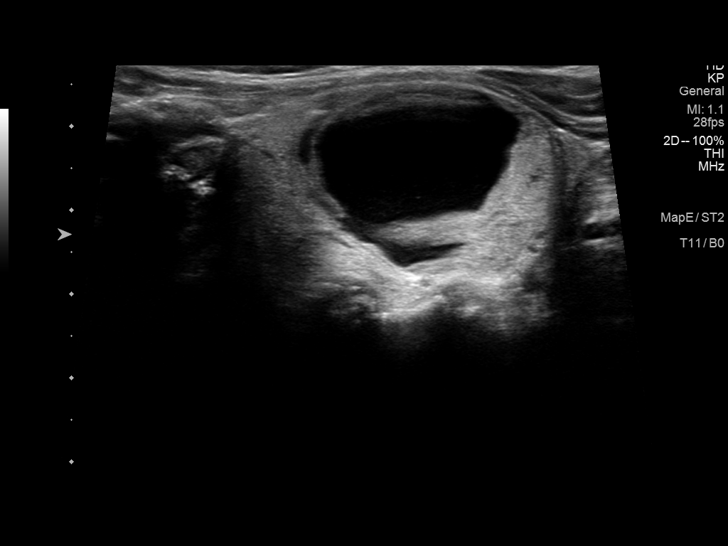
[im 55/94]
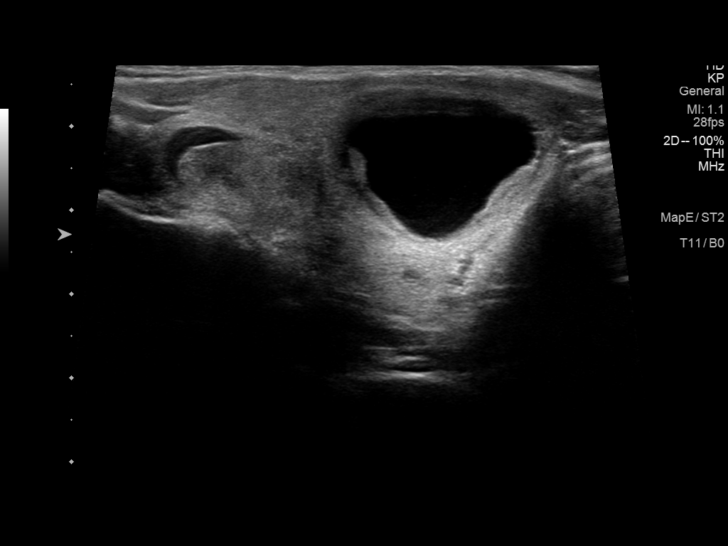
[im 63/94]
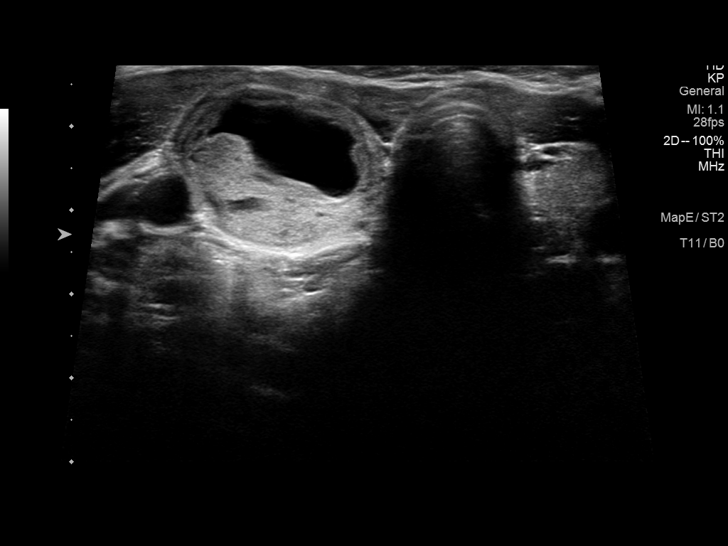
[im 70/94]
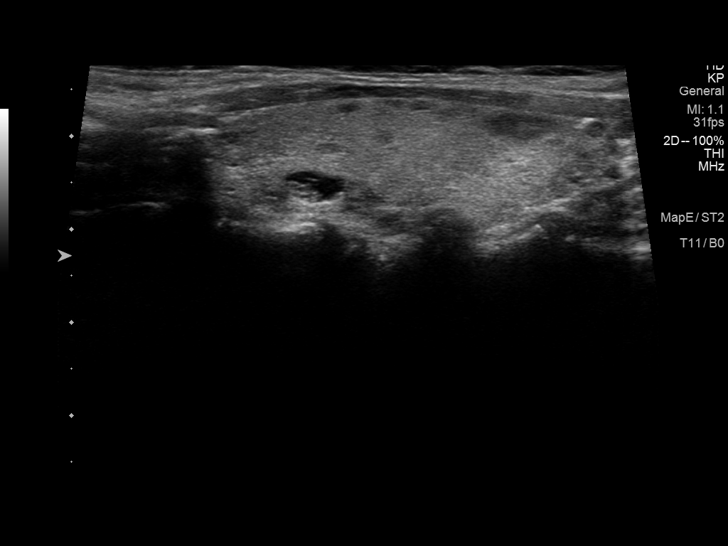
[im 78/94]
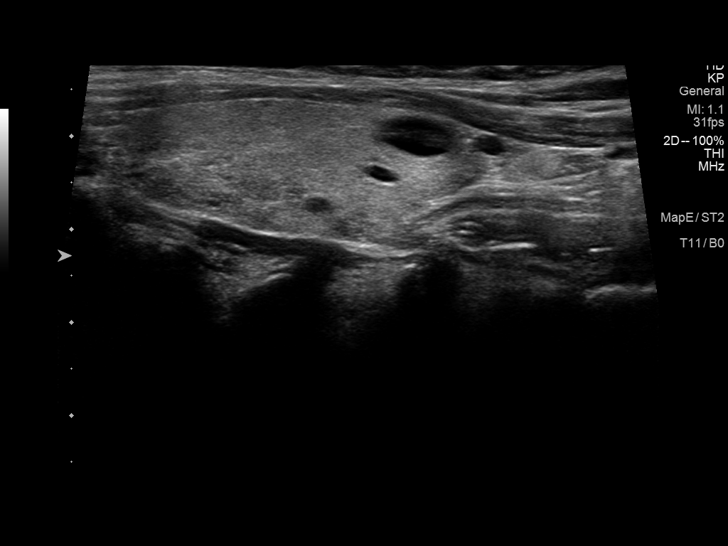
[im 86/94]
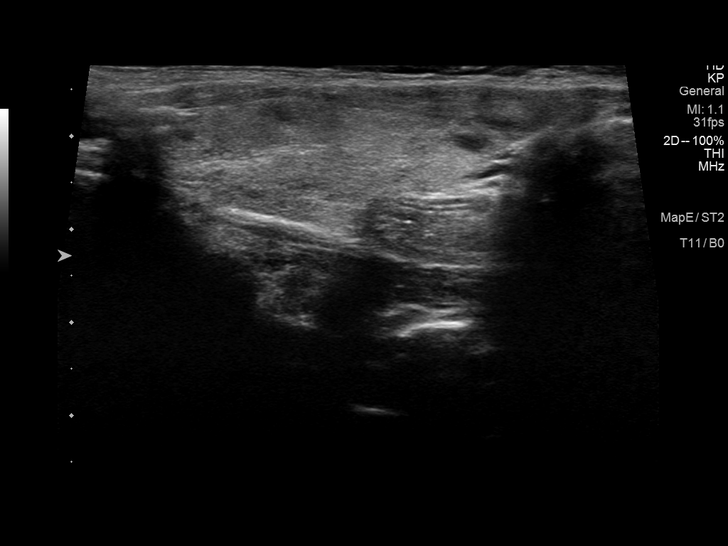
[im 94/94]
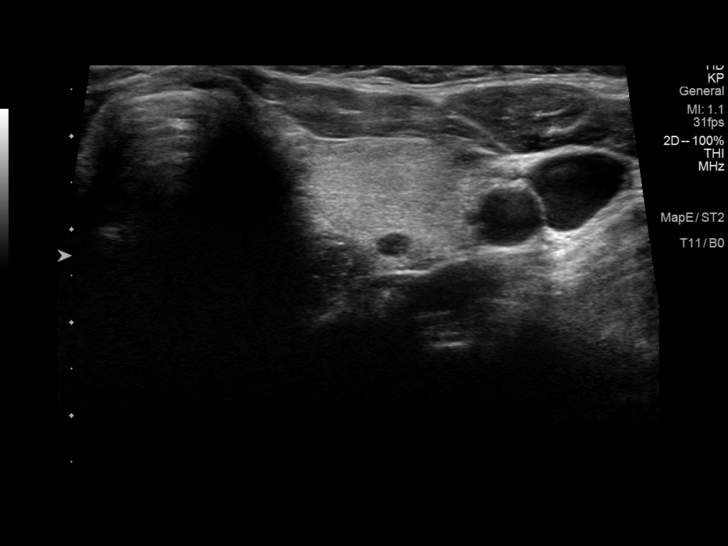

[13 of 25 positions shown; findings below may reference images not displayed]

FINDINGS: Parenchymal Echotexture: Mildly heterogenous

Isthmus: 0.1 cm, previously 0.3 cm

Right lobe: 5.6 x 2.3 x 2.9 cm, previously 4.6 x 2.1 x 2.2 cm

Left lobe: 5.5 x 1.6 x 2.1 cm, previously 4.6 x 1.6 x 1.9 cm

_________________________________________________________

Estimated total number of nodules >/= 1 cm: 4

Number of spongiform nodules >/=  2 cm not described below (TR1): 0

Number of mixed cystic and solid nodules >/= 1.5 cm not described
below (TR2): 0

_________________________________________________________

Nodule # 1:

Prior biopsy: No

Location: Right; Superior

Maximum size: 2.2 cm; Other 2 dimensions: 1.6 x 1.1 cm, previously,
1.8 x 1.4 x 1.0 cm

Composition: mixed cystic and solid (1)

Echogenicity: isoechoic (1)

Shape: not taller-than-wide (0)

Margins: smooth (0)

Echogenic foci: none (0)

ACR TI-RADS total points: 2.

ACR TI-RADS risk category:  TR2 (2 points).

Significant change in size (>/= 20% in two dimensions and minimal
increase of 2 mm): No

Change in features: Yes

Change in ACR TI-RADS risk category: Yes

ACR TI-RADS recommendations:

This nodule does NOT meet TI-RADS criteria for biopsy or dedicated
follow-up.

_________________________________________________________

Similar appearance of the benign, mixed cystic and solid nodule in
the right inferior thyroid (labeled 2, 3.3 cm, previously 2.5 cm).

Multiple benign-appearing scattered mixed solid cystic and cystic
nodules in the left thyroid, similar to comparison.

_________________________________________________________
IMPRESSION: 1. Similar appearing multinodular goiter.
2. Previously visualized right superior thyroid nodule appears
similar with mixed cystic and solid characteristics, categorized as
TI-RADS category 2, therefore not requiring additional ultrasound
follow-up or tissue sampling.

The above is in keeping with the ACR TI-RADS recommendations - [HOSPITAL] [QO];[DATE].

## 2021-05-15 ENCOUNTER — Other Ambulatory Visit: Payer: Self-pay | Admitting: Urology

## 2021-05-15 DIAGNOSIS — N281 Cyst of kidney, acquired: Secondary | ICD-10-CM

## 2021-07-13 ENCOUNTER — Other Ambulatory Visit: Payer: PRIVATE HEALTH INSURANCE

## 2021-08-02 ENCOUNTER — Other Ambulatory Visit: Payer: Self-pay

## 2021-08-04 ENCOUNTER — Other Ambulatory Visit: Payer: Self-pay

## 2021-08-04 ENCOUNTER — Ambulatory Visit
Admission: RE | Admit: 2021-08-04 | Discharge: 2021-08-04 | Disposition: A | Discharge status: Home / Self Care | Payer: No Typology Code available for payment source | Source: Ambulatory Visit | Attending: Urology | Admitting: Urology

## 2021-08-04 DIAGNOSIS — N281 Cyst of kidney, acquired: Secondary | ICD-10-CM

## 2021-08-04 IMAGING — MR MR ABDOMEN WO/W CM
12 of 17 series · 28 of 48 positions shown · IV contrast (multihance)
Comparison: CT [DATE] and MRI [DATE]

CLINICAL DATA: Follow-up left renal lesion, history of renal
infections.

EXAM:
MRI ABDOMEN WITHOUT AND WITH CONTRAST
TECHNIQUE: Multiplanar multisequence MR imaging of the abdomen was performed
both before and after the administration of intravenous contrast.
CONTRAST:  13mL MULTIHANCE GADOBENATE DIMEGLUMINE 529 MG/ML IV SOLN

[Series 3: T2 fat-sat · axial · 6.0mm · 1.09mm/px · 1 of 30 slices shown]
[im 1/30]
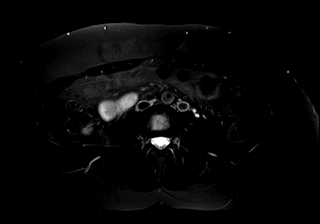

[Series 4: cor haste · coronal · 5.0mm · 0.68mm/px · 1 of 32 slices shown]
[im 1/32]
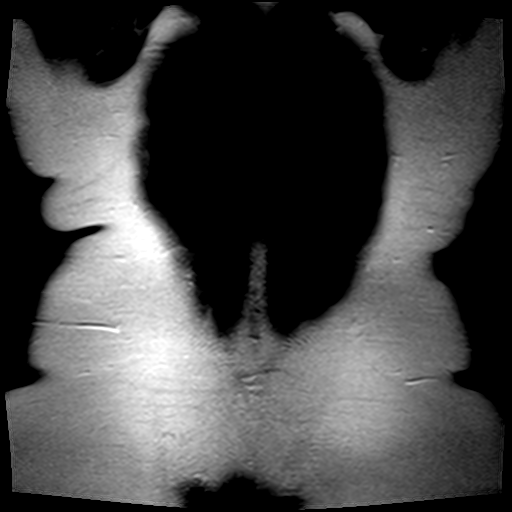

[Series 5: axial haste · axial · 6.0mm · 0.68mm/px · 1 of 31 slices shown]
[im 1/31]
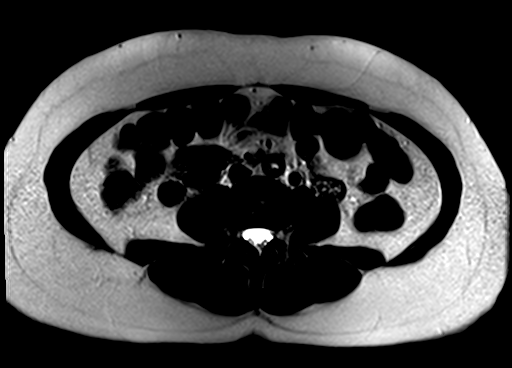

[Series 6: ep2d_diff_b50_500_800_p2_trig · axial · 6.0mm · 1.82mm/px · z∈[-52,+157]mm · 3 of 90 slices shown]
[im 1/90]
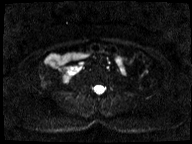
[im 45/90]
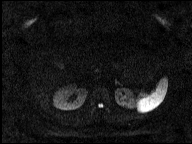
[im 90/90]
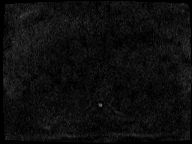

[Series 7: ep2d_diff_b50_500_800_p2_trig_adc · axial · 6.0mm · 1.82mm/px · 1 of 30 slices shown]
[im 1/30]
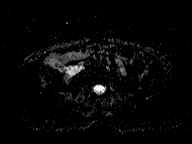

[Series 8: T1 · axial · 6.0mm · 0.68mm/px · 1 of 62 slices shown]
[im 1/62]
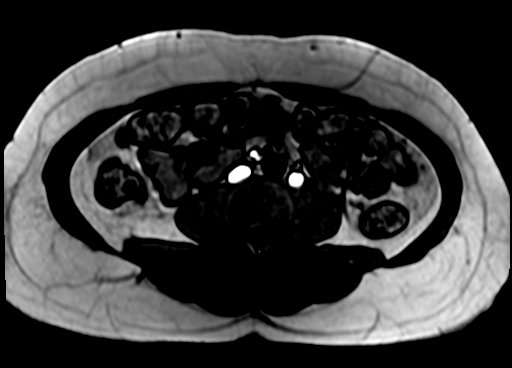

[Series 9: bSSFP · axial · 4.0mm · 0.68mm/px · z∈[-72,+136]mm · 2 of 53 slices shown]
[im 1/53]
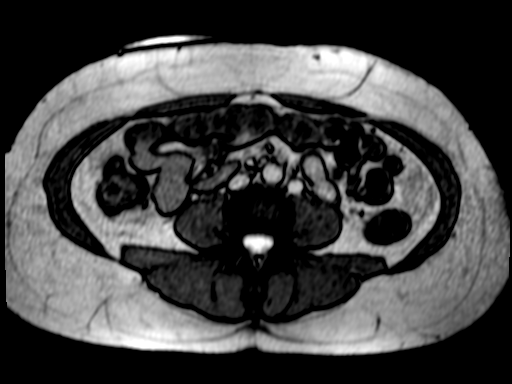
[im 53/53]
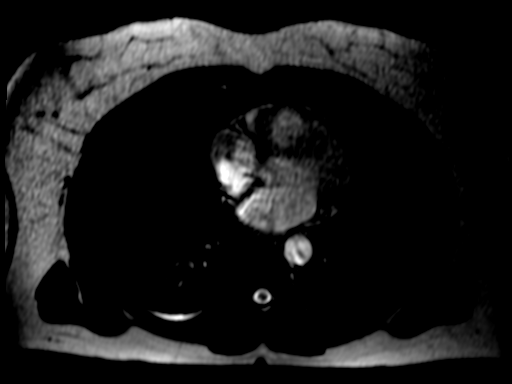

[Series 10: T1 dynamic · axial · non-contrast · 2.5mm · 0.74mm/px · z∈[-76,+141]mm · 4 of 88 slices shown]
[im 1/88]
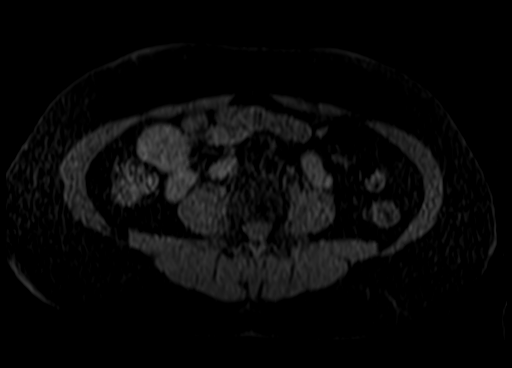
[im 30/88]
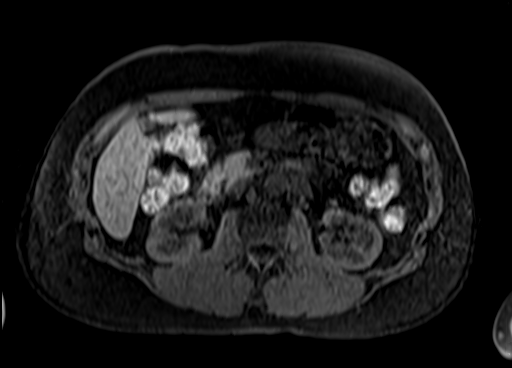
[im 59/88]
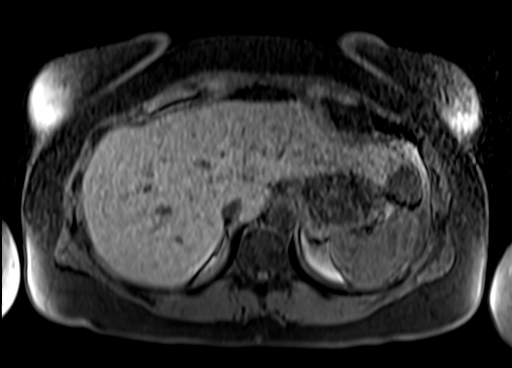
[im 88/88]
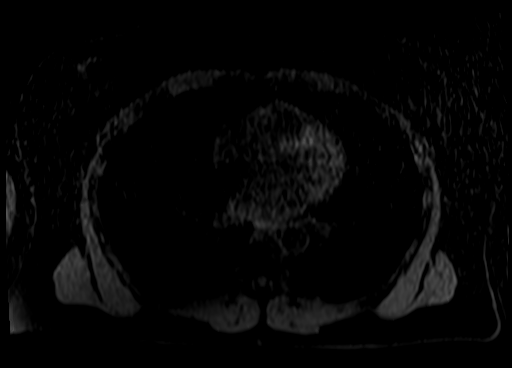

[Series 11: T1 dynamic post-contrast · axial · 2.5mm · 0.74mm/px · z∈[-76,+141]mm · 4 of 88 slices shown (1 of 4)]
[im 1/88]
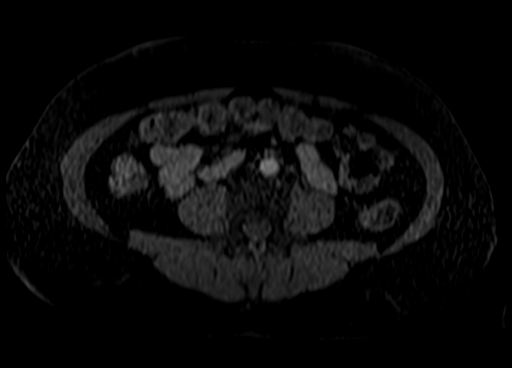
[im 30/88]
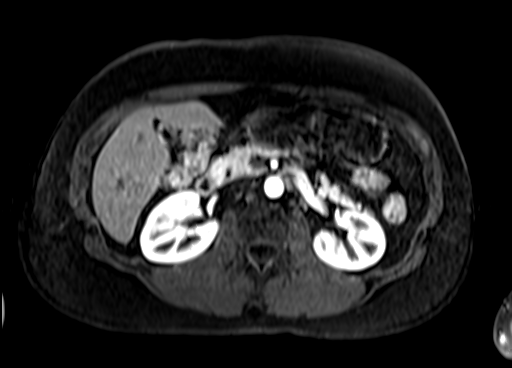
[im 59/88]
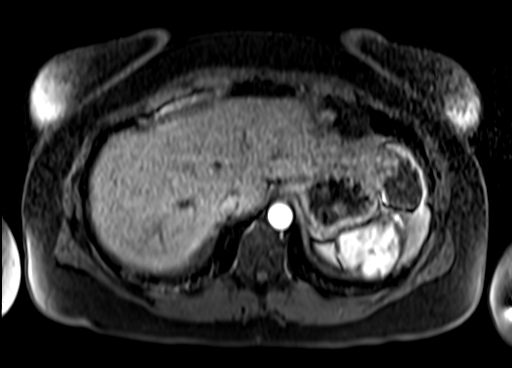
[im 88/88]
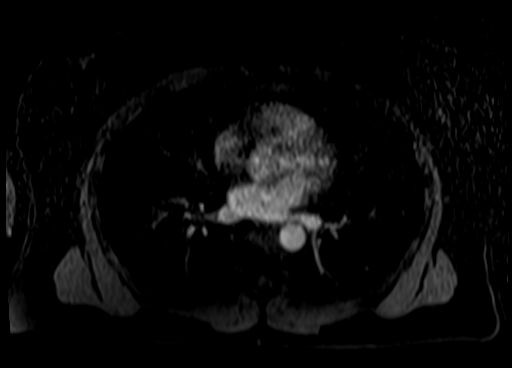

[Series 12: T1 dynamic post-contrast · axial · 2.5mm · 0.74mm/px · z∈[-76,+141]mm · 3 of 87 slices shown (2 of 4)]
[im 1/87]
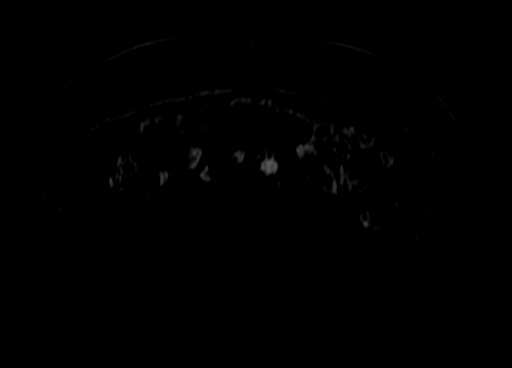
[im 44/87]
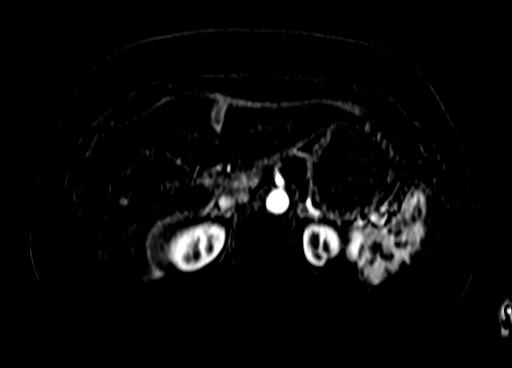
[im 87/87]
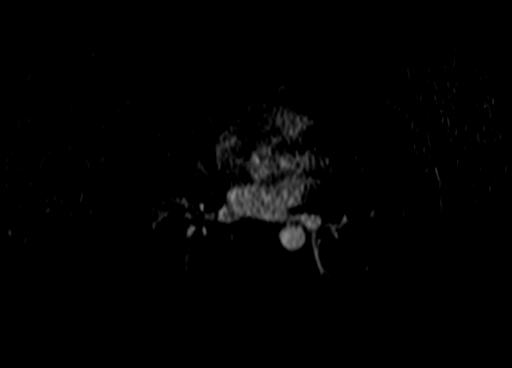

[Series 13: T1 dynamic post-contrast · axial · 2.5mm · 0.74mm/px · z∈[-76,+141]mm · 4 of 88 slices shown (3 of 4)]
[im 1/88]
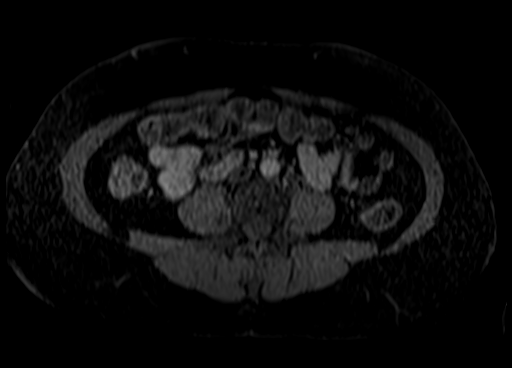
[im 30/88]
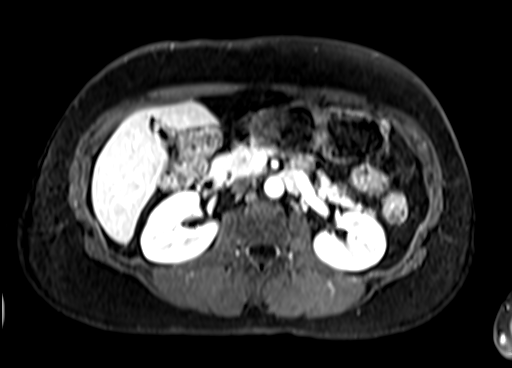
[im 59/88]
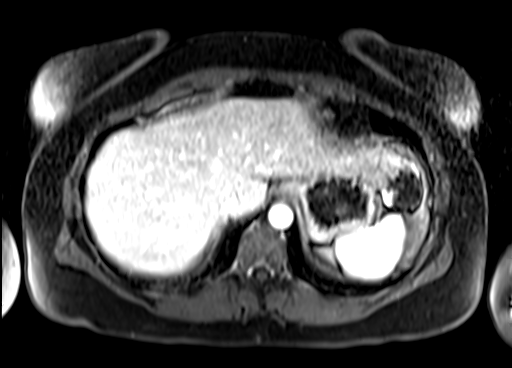
[im 88/88]
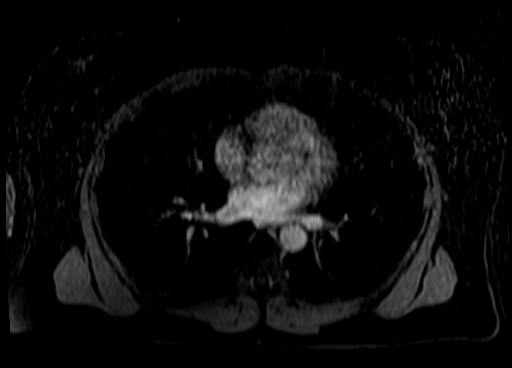

[Series 14: T1 dynamic post-contrast · axial · 2.5mm · 0.74mm/px · z∈[-76,+69]mm · 3 of 88 slices shown (4 of 4)]
[im 1/88]
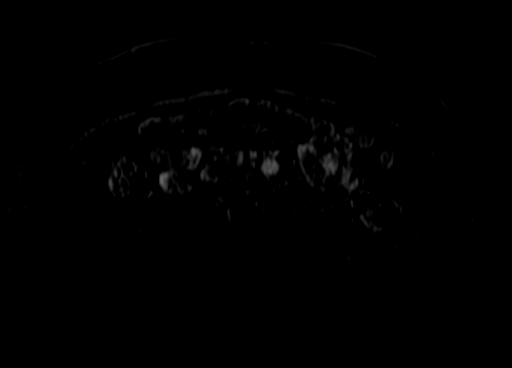
[im 30/88]
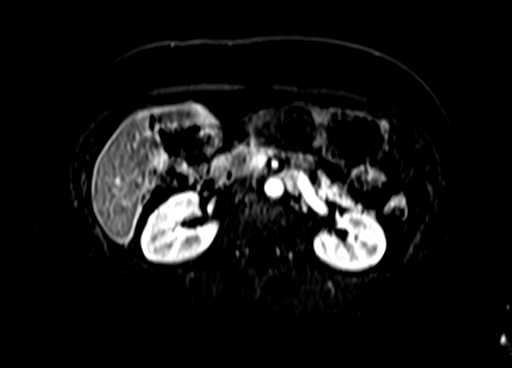
[im 59/88]
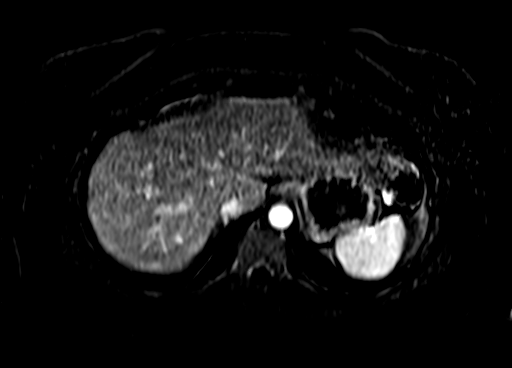

[28 of 48 positions shown; findings below may reference images not displayed]

FINDINGS: Lower chest: No acute findings.

Hepatobiliary: Stable 3.3 cm hepatic hemangioma in the lateral
segment of the left lobe of the liver. No suspicious hepatic
lesions. Gallbladder is unremarkable. No biliary ductal dilation.

Pancreas: Intrinsic T1 signal of the pancreatic parenchyma is within
normal limits. No pancreatic ductal dilation. No evidence of acute
inflammation. No cystic or solid hyperenhancing pancreatic mass
visualized.

Spleen:  Within normal limits.

Adrenals/Urinary Tract:  Bilateral adrenal glands are unremarkable.

No hydronephrosis. No solid enhancing renal mass. Wedge-shaped
nonenhancing area in the left upper pole kidney on image [DATE] and
[DATE], with indentation of the overlying parenchyma, corresponding in
location to the previously seen complex cystic lesion on prior MRI.

Stomach/Bowel: Visualized portions within the abdomen are
unremarkable.

Vascular/Lymphatic: Duplicated IVC. No pathologically enlarged
abdominal lymph nodes. No abdominal aortic aneurysm.

Other:  No abdominal free fluid.

Musculoskeletal: No suspicious bone lesions identified.
IMPRESSION: 1. Wedge-shaped area of nonenhancing scarring in the left upper pole
kidney, corresponding with the previously seen complex cystic lesion
on prior MRI, most consistent with post infectious scarring. No
suspicious renal lesion.
2. Stable 3.3 cm benign hepatic hemangioma in the lateral segment of
the left lobe of the liver.
3. Duplicated IVC.

## 2021-08-04 MED ORDER — GADOBENATE DIMEGLUMINE 529 MG/ML IV SOLN
13.0000 mL | Freq: Once | INTRAVENOUS | Status: AC | PRN
  Administered 2021-08-04: 13 mL via INTRAVENOUS
